# Patient Record
Sex: Male | Born: 1950 | Race: White | Hispanic: No | Marital: Married | Smoking: Former smoker
Health system: Southern US, Community
[De-identification: ages and names within clinical notes are randomized; demographics above are authoritative.]

## PROBLEM LIST (undated history)

## (undated) DIAGNOSIS — A6920 Lyme disease, unspecified: Secondary | ICD-10-CM

## (undated) DIAGNOSIS — E785 Hyperlipidemia, unspecified: Secondary | ICD-10-CM

## (undated) DIAGNOSIS — H269 Unspecified cataract: Secondary | ICD-10-CM

## (undated) DIAGNOSIS — N200 Calculus of kidney: Secondary | ICD-10-CM

## (undated) DIAGNOSIS — I6529 Occlusion and stenosis of unspecified carotid artery: Secondary | ICD-10-CM

## (undated) DIAGNOSIS — E039 Hypothyroidism, unspecified: Secondary | ICD-10-CM

## (undated) DIAGNOSIS — M47816 Spondylosis without myelopathy or radiculopathy, lumbar region: Secondary | ICD-10-CM

## (undated) DIAGNOSIS — N281 Cyst of kidney, acquired: Secondary | ICD-10-CM

## (undated) DIAGNOSIS — I10 Essential (primary) hypertension: Secondary | ICD-10-CM

## (undated) HISTORY — DX: Cyst of kidney, acquired: N28.1

## (undated) HISTORY — DX: Hypothyroidism, unspecified: E03.9

## (undated) HISTORY — DX: Essential (primary) hypertension: I10

## (undated) HISTORY — DX: Unspecified cataract: H26.9

## (undated) HISTORY — DX: Spondylosis without myelopathy or radiculopathy, lumbar region: M47.816

## (undated) HISTORY — DX: Lyme disease, unspecified: A69.20

## (undated) HISTORY — DX: Occlusion and stenosis of unspecified carotid artery: I65.29

## (undated) HISTORY — DX: Calculus of kidney: N20.0

## (undated) HISTORY — DX: Hyperlipidemia, unspecified: E78.5

---

## 1994-09-29 ENCOUNTER — Encounter: Payer: Self-pay | Admitting: Family Medicine

## 1994-09-29 LAB — CONVERTED CEMR LAB: PSA: 0.8 ng/mL

## 2001-04-29 ENCOUNTER — Encounter: Payer: Self-pay | Admitting: Family Medicine

## 2001-04-29 LAB — CONVERTED CEMR LAB: PSA: 0.5 ng/mL

## 2001-10-28 HISTORY — PX: COLONOSCOPY: SHX174

## 2004-04-29 ENCOUNTER — Encounter: Payer: Self-pay | Admitting: Family Medicine

## 2004-04-29 LAB — CONVERTED CEMR LAB: PSA: 0.8 ng/mL

## 2004-10-19 HISTORY — PX: COLONOSCOPY: SHX174

## 2005-05-31 ENCOUNTER — Ambulatory Visit: Payer: Self-pay | Admitting: Family Medicine

## 2005-08-09 ENCOUNTER — Ambulatory Visit: Payer: Self-pay | Admitting: Family Medicine

## 2005-11-12 ENCOUNTER — Ambulatory Visit: Payer: Self-pay | Admitting: Family Medicine

## 2005-12-30 HISTORY — PX: FOREIGN BODY REMOVAL: SHX962

## 2006-01-14 ENCOUNTER — Ambulatory Visit: Payer: Self-pay | Admitting: Family Medicine

## 2006-08-19 ENCOUNTER — Ambulatory Visit: Payer: Self-pay | Admitting: Family Medicine

## 2006-08-19 LAB — CONVERTED CEMR LAB
PSA: 0.52 ng/mL
TSH: 8.08 u[IU]/mL

## 2006-08-21 ENCOUNTER — Ambulatory Visit: Payer: Self-pay | Admitting: Family Medicine

## 2006-11-25 ENCOUNTER — Ambulatory Visit: Payer: Self-pay | Admitting: Family Medicine

## 2007-05-22 ENCOUNTER — Encounter: Payer: Self-pay | Admitting: Family Medicine

## 2007-05-25 DIAGNOSIS — R7303 Prediabetes: Secondary | ICD-10-CM | POA: Insufficient documentation

## 2007-05-25 DIAGNOSIS — Z87891 Personal history of nicotine dependence: Secondary | ICD-10-CM | POA: Insufficient documentation

## 2007-05-25 DIAGNOSIS — E039 Hypothyroidism, unspecified: Secondary | ICD-10-CM | POA: Insufficient documentation

## 2007-05-25 DIAGNOSIS — M479 Spondylosis, unspecified: Secondary | ICD-10-CM | POA: Insufficient documentation

## 2007-05-25 DIAGNOSIS — I10 Essential (primary) hypertension: Secondary | ICD-10-CM

## 2007-05-25 DIAGNOSIS — E785 Hyperlipidemia, unspecified: Secondary | ICD-10-CM | POA: Insufficient documentation

## 2007-05-26 ENCOUNTER — Ambulatory Visit: Payer: Self-pay | Admitting: Family Medicine

## 2009-09-21 ENCOUNTER — Encounter (INDEPENDENT_AMBULATORY_CARE_PROVIDER_SITE_OTHER): Payer: Self-pay | Admitting: *Deleted

## 2010-02-08 ENCOUNTER — Ambulatory Visit: Payer: Self-pay | Admitting: Family Medicine

## 2010-02-08 DIAGNOSIS — L03119 Cellulitis of unspecified part of limb: Secondary | ICD-10-CM | POA: Insufficient documentation

## 2010-02-08 DIAGNOSIS — L02519 Cutaneous abscess of unspecified hand: Secondary | ICD-10-CM | POA: Insufficient documentation

## 2010-02-15 ENCOUNTER — Ambulatory Visit: Payer: Self-pay | Admitting: Family Medicine

## 2010-05-22 ENCOUNTER — Telehealth (INDEPENDENT_AMBULATORY_CARE_PROVIDER_SITE_OTHER): Payer: Self-pay | Admitting: *Deleted

## 2010-08-07 ENCOUNTER — Encounter (INDEPENDENT_AMBULATORY_CARE_PROVIDER_SITE_OTHER): Payer: Self-pay | Admitting: *Deleted

## 2010-08-16 ENCOUNTER — Encounter: Payer: Self-pay | Admitting: Internal Medicine

## 2010-09-25 ENCOUNTER — Encounter: Payer: Self-pay | Admitting: Internal Medicine

## 2010-11-02 ENCOUNTER — Encounter (INDEPENDENT_AMBULATORY_CARE_PROVIDER_SITE_OTHER): Payer: Self-pay | Admitting: *Deleted

## 2010-11-06 ENCOUNTER — Ambulatory Visit: Payer: Self-pay | Admitting: Internal Medicine

## 2010-11-20 ENCOUNTER — Ambulatory Visit: Payer: Self-pay | Admitting: Internal Medicine

## 2010-11-20 HISTORY — PX: COLONOSCOPY: SHX174

## 2011-01-23 ENCOUNTER — Ambulatory Visit
Admission: RE | Admit: 2011-01-23 | Discharge: 2011-01-23 | Payer: Self-pay | Source: Home / Self Care | Attending: Family Medicine | Admitting: Family Medicine

## 2011-01-30 NOTE — Letter (Signed)
Summary: [facility] Instructions  [facility]  [address] [place], [place] [address]   Phone: [phone]  Fax: [phone]       SOMTOCHUKWU WOOLLARD    03-16-1951    MRN: [id number]        Procedure Day /Date: Tuesday 11-20-10     Arrival Time: 7:30 am     Procedure Time: 8:30 am     Location of Procedure:                    _x _  [facility] (4th Floor)                        PREPARATION FOR COLONOSCOPY WITH MOVIPREP   Starting 5 days prior to your procedure  11-15-10 do not eat nuts, seeds, popcorn, corn, beans, peas,  salads, or any raw vegetables.  Do not take any fiber supplements (e.g. Metamucil, Citrucel, and Benefiber).  THE DAY BEFORE YOUR PROCEDURE         DATE:  11-19-10  DAY: Monday  1.  Drink clear liquids the entire day-NO SOLID FOOD  2.  Do not drink anything colored red or purple.  Avoid juices with pulp.  No orange juice.  3.  Drink at least 64 oz. (8 glasses) of fluid/clear liquids during the day to prevent dehydration and help the prep work efficiently.  CLEAR LIQUIDS INCLUDE: Water Jello Ice Popsicles Tea (sugar ok, no milk/cream) Powdered fruit flavored drinks Coffee (sugar ok, no milk/cream) Gatorade Juice: apple, white grape, white cranberry  Lemonade Clear bullion, consomm, broth Carbonated beverages (any kind) Strained chicken noodle soup Hard Candy                             4.  In the morning, mix first dose of MoviPrep solution:    Empty 1 Pouch A and 1 Pouch B into the disposable container    Add lukewarm drinking water to the top line of the container. Mix to dissolve    Refrigerate (mixed solution should be used within 24 hrs)  5.  Begin drinking the prep at 5:00 p.m. The MoviPrep container is divided by 4 marks.   Every 15 minutes drink the solution down to the next mark (approximately 8 oz) until the full liter is complete.   6.  Follow completed prep with 16 oz of clear liquid of your choice  (Nothing red or purple).  Continue to drink clear liquids until bedtime.  7.  Before going to bed, mix second dose of MoviPrep solution:    Empty 1 Pouch A and 1 Pouch B into the disposable container    Add lukewarm drinking water to the top line of the container. Mix to dissolve    Refrigerate  THE DAY OF YOUR PROCEDURE      DATE: 11-20-10  DAY:  Tuesday  Beginning at  3:30 a.m. (5 hours before procedure):         1. Every 15 minutes, drink the solution down to the next mark (approx 8 oz) until the full liter is complete.  2. Follow completed prep with 16 oz. of clear liquid of your choice.    3. You may drink clear liquids until  6:30 a.m.  (2 HOURS BEFORE PROCEDURE).   MEDICATION INSTRUCTIONS  Unless otherwise instructed, you should take regular prescription medications with a small sip of water  as early as possible the morning of your procedure.        OTHER INSTRUCTIONS  You will need a responsible adult at least 62 years of age to accompany you and drive you home.   This person must remain in the waiting room during your procedure.  Wear loose fitting clothing that is easily removed.  Leave jewelry and other valuables at home.  However, you may wish to bring a book to read or  an iPod/MP3 player to listen to music as you wait for your procedure to start.  Remove all body piercing jewelry and leave at home.  Total time from sign-in until discharge is approximately 2-3 hours.  You should go home directly after your procedure and rest.  You can resume normal activities the  day after your procedure.  The day of your procedure you should not:   Drive   Make legal decisions   Operate machinery   Drink alcohol   Return to work  You will receive specific instructions about eating, activities and medications before you leave.    The above instructions have been reviewed and explained to me by  Wyona Almas RN  November 06, 2010 8:23 AM     I fully  understand and can verbalize these instructions _____________________________ Date _________

## 2011-01-30 NOTE — Progress Notes (Signed)
Summary: Schedule recall colonoscopy  Phone Note Outgoing Call   Call placed by: Christie Nottingham CMA Duncan Dull),  May 22, 2010 1:44 PM Call placed to: Patient Summary of Call: Called pt to schedule recall colonoscopy and pt states that he is retired cannot afford to have the procedure done right now but would like to schedule in 6 months time. Pt would like a reminder letter in 6 months to have the procedure done. Letter reminder put in IDX. Initial call taken by: Christie Nottingham CMA Duncan Dull),  May 22, 2010 1:45 PM

## 2011-01-30 NOTE — Assessment & Plan Note (Signed)
 Summary: FOLLOW UP/EVE   Vital Signs:  Patient Profile:   60 Years Old Male Weight:      177 pounds Temp:     98.5 degrees F oral Pulse rate:   60 / minute BP sitting:   130 / 80  (left arm)  Vitals Entered By: Apolinar Cones (May 26, 2007 3:56 PM)               Chief Complaint:  6 MONTH F/U.  History of Present Illness: No complaints except anxious to be here.  Feels well with no real complaints.  Cough of chronic nature when here last time is resolved.  He has list of BPs taken over last few weeks which are all below 130/80 except one.   Current Allergies: No known allergies     Risk Factors:  Passive smoke exposure:  no Drug use:  no HIV high-risk behavior:  no   Review of Systems       The patient complains of decreased hearing.  The patient denies anorexia, fever, weight loss, vision loss, hoarseness, chest pain, syncope, dyspnea on exhertion, peripheral edema, prolonged cough, hemoptysis, abdominal pain, melena, hematochezia, severe indigestion/heartburn, hematuria, incontinence, genital sores, muscle weakness, suspicious skin lesions, transient blindness, difficulty walking, depression, unusual weight change, abnormal bleeding, enlarged lymph nodes, angioedema, breast masses, and testicular masses.         wears hearing aids bilaterally.   Physical Exam  General:     Well-developed,well-nourished,in no acute distress; alert,appropriate and cooperative throughout examination Head:     Normocephalic and atraumatic without obvious abnormalities. No apparent alopecia or balding. Eyes:     Conjunctiva clear bilaterally.  Ears:     External ear exam shows no significant lesions or deformities.  Otoscopic examination reveals clear canals, tympanic membranes are intact bilaterally without bulging, retraction, inflammation or discharge. Hearing aids in place bilat. Nose:     External nasal examination shows no deformity or inflammation. Nasal mucosa are pink and  moist without lesions or exudates. Mouth:     Oral mucosa and oropharynx without lesions or exudates.  Teeth in good repair. Neck:     No deformities, masses, or tenderness noted. Chest Wall:     No deformities, masses, tenderness or gynecomastia noted. Lungs:     Normal respiratory effort, chest expands symmetrically. Lungs are clear to auscultation, no crackles or wheezes. Heart:     Normal rate and regular rhythm. S1 and S2 normal without gallop, murmur, click, rub or other extra sounds.    Impression & Recommendations:  Problem # 1:  HYPERTENSION (ICD-401.9) Assessment: Improved Doing well on no meds.  Will continue to follow. Stay away from salt and keep weight down (has found if he stops sodas, he can drop five poundsquickly which he did the last few wekks to prepare fo rthis visit.) BP today: 130/80   Problem # 2:  HYPERGLYCEMIA (ICD-790.6) Assessment: Unchanged Encouraged as above ...sodas probably important here also.   Patient Instructions: 1)  At pt's request, RTC 10 months for Comp Exam, labs prior. Pt will call in 4 mos.

## 2011-01-30 NOTE — Letter (Signed)
Summary: Pre Visit Letter Revised  [facility]  [address] [place], [place] [address]   Phone: [phone]  Fax: [phone]        09/25/2010 MRN: [id number]   [facility] [address] [place], [place]  [address]             Procedure Date:  November 20, 2010   Welcome to the Gastroenterology Division at [facility].    You are scheduled to see a nurse for your pre-procedure visit on November 06, 2010 at 8:00am on the 3rd floor at [facility], [address]  We ask that you try to arrive at our office 15 minutes prior to your appointment time to allow for check-in.  Please take a minute to review the attached form.  If you answer "Yes" to one or more of the questions on the first page, we ask that you call the person listed at your earliest opportunity.  If you answer "No" to all of the questions, please complete the rest of the form and bring it to your appointment.    Your nurse visit will consist of discussing your medical and surgical history, your immediate family medical history, and your medications.   If you are unable to list all of your medications on the form, please bring the medication bottles to your appointment and we will list them.  We will need to be aware of both prescribed and over the counter drugs.  We will need to know exact dosage information as well.    Please be prepared to read and sign documents such as consent forms, a financial agreement, and acknowledgement forms.  If necessary, and with your consent, a friend or relative is welcome to sit-in on the nurse visit with you.  Please bring your insurance card so that we may make a copy of it.  If your insurance requires a referral to see a specialist, please bring your referral form from your primary care physician.  No co-pay is required for this nurse visit.     If you cannot keep your appointment, please call [phone] to cancel or reschedule prior to your  appointment date.  This allows [place] the opportunity to schedule an appointment for another patient in need of care.    Thank you for choosing [facility] for your medical needs.  We appreciate the opportunity to care for you.  Please visit [place] at our website  to learn more about our practice.  Sincerely, The Gastroenterology Division

## 2011-01-30 NOTE — Assessment & Plan Note (Signed)
Summary: ROA /JRR   Vital Signs:  Patient profile:   60 year old male Weight:      185.75 pounds Temp:     98.6 degrees F oral Pulse rate:   72 / minute Pulse rhythm:   regular BP sitting:   150 / 84  (left arm) Cuff size:   regular  Vitals Entered By: Sydell Axon LPN (February 15, 2010 9:57 AM) CC: Follow-up on splinter in left hand   History of Present Illness: Pt here for followup of cellulitis of the left hand presumably from a splinter, not seen. He has responded well to Ab. He has soaked as directed form11/2 days!   He aslo has jury summons and has hearing difficulties, wears hearing aids and would not be able to hear much in the courtroom. He also got scratched by his cat last night on the left upper eyelid.  Problems Prior to Update: 1)  Cellulitis, Hand, Left  (ICD-682.4) 2)  Fungal Dermatitis, Inguinal Area  (ICD-111.9) 3)  Hyperglycemia  (ICD-790.6) 4)  Hx of Disorder, Tobacco Use  (ICD-305.1) 5)  Hx of Degenerative Joint Disease, Lumbar Spine  (ICD-721.90) 6)  Hx of Hypercholesterolemia, Borderline  (ICD-272.4) 7)  Hypothyroidism  (ICD-244.9) 8)  Hypertension  (ICD-401.9)  Medications Prior to Update: 1)  Keflex 500 Mg Caps (Cephalexin) .... One Tab By Mouth 4 Times A Day. 2)  Nystatin 100000 Unit/gm Crea (Nystatin) .... Apply To Area Two Times A Day For  Minimum One Week  Allergies: No Known Drug Allergies  Physical Exam  General:  Well-developed,well-nourished,in no acute distress; alert,appropriate and cooperative throughout examination Head:  Normocephalic and atraumatic without obvious abnormalities. No apparent alopecia or balding. Eyes:  Conjunctiva clear bilaterally. 4mm scratch of the left upper eyelid, scabbed and looks to be healing, no erythema or swelling. Ears:  External ear exam shows no significant lesions or deformities.  Otoscopic examination reveals clear canals, tympanic membranes are intact bilaterally without bulging, retraction,  inflammation or discharge. Hearing aids in place bilat. Nose:  External nasal examination shows no deformity or inflammation. Nasal mucosa are pink and moist without lesions or exudates. Mouth:  Oral mucosa and oropharynx without lesions or exudates.  Teeth in good repair. Skin:  Left hand no longer swollen and very faint erythema generally of the MCP area, dorsal surface o/w back to nml. Sensation is intact. 2Pt discimination at the fingertips is intact.  Intertriginal area signif for oval macular minimally  erythematously outlined rash. Improved. Cervical Nodes:  No lymphadenopathy noted Axillary Nodes:  No palpable lymphadenopathy   Impression & Recommendations:  Problem # 1:  CELLULITIS, HAND, LEFT (ICD-682.4) Assessment Improved  Extend Keflex another 7 days. His updated medication list for this problem includes:    Keflex 500 Mg Caps (Cephalexin) ..... One tab by mouth 4 times a day.    Keflex 500 Mg Caps (Cephalexin) ..... One tab 4 times a day  Elevate affected area. Warm moist compresses for 20 minutes every 2 hours while awake. Take antibiotics as directed and take acetaminophen as needed. To be seen in 48-72 hours if no improvement, sooner if worse.  Problem # 2:  FUNGAL DERMATITIS, INGUINAL AREA (ICD-111.9) Assessment: Improved Cont two times a day applic until gone for 24 hrs. His updated medication list for this problem includes:    Nystatin 100000 Unit/gm Crea (Nystatin) .Marland Kitchen... 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  Problem # 4:  CAT SCRATCH (ICD-959.9) Assessment: New Left upper eyelid, looks to be healing, no adenopathy felt. Is on Abs.  Problem # 5:  HYPERTENSION (ICD-401.9) Assessment: Unchanged Still mildly elevated. Address next visit. BP today: 150/84 Prior BP: 144/84 (02/08/2010)  Complete  Medication List: 1)  Keflex 500 Mg Caps (Cephalexin) .... One tab by mouth 4 times a day. 2)  Nystatin 100000 Unit/gm Crea (Nystatin) .... Apply to area two times a day for  minimum one week 3)  Keflex 500 Mg Caps (Cephalexin) .... One tab 4 times a day  Patient Instructions: 1)  Check BP and adjust as needed. Prescriptions: KEFLEX 500 MG CAPS (CEPHALEXIN) one tab 4 times a day  #28 x 0   Entered and Authorized by:   Shaune Leeks MD   Signed by:   Shaune Leeks MD on 02/15/2010   Method used:   [facility]        [facility]  [address] (retail)       [address]       [place], [place]  [address]       Ph: [phone] or [phone]       Fax: [phone]   RxID:   [id number]   Current Allergies (reviewed today): No known allergies

## 2011-01-30 NOTE — Letter (Signed)
Summary: Generic Letter  [place] [facility]  [address] [place], [place] [address]   Phone: [phone]  Fax: [phone]    02/15/2010  To whom it may concern:  RE: Henry Baldwin     [address]     [place], [place]  [address]   Mr. DURNIN is a patientof mine who has hearing decliune and wears hearing aids which do not totally replace his hearing deficit. It would be very difficult for him to hear the proceedings. Please excuse from jury duty.   Sincerely,   Laurita Quint MD

## 2011-01-30 NOTE — Letter (Signed)
 Summary: Recall Colonoscopy Letter  [facility]  [address] [place], [place] [address]   Phone: [phone]  Fax: [phone]      September 21, 2009 MRN: [id number]   [facility] PASION [address] [place], [place]  [address]   Dear Mr. NEWMARK,   According to your medical record, it is time for you to schedule a Colonoscopy. The American Cancer Society recommends this procedure as a method to detect early colon cancer. Patients with a family history of colon cancer, or a personal history of colon polyps or inflammatory bowel disease are at increased risk.  This letter has beeen generated based on the recommendations made at the time of your procedure. If you feel that in your particular situation this may no longer apply, please contact our office.  Please call our office at [phone] to schedule this appointment or to update your records at your earliest convenience.  Thank you for cooperating with us  to provide you with the very best care possible.   Sincerely,  Norleen SAILOR. Abran, M.D.  [facility]astroenter[facility] Division [phone]

## 2011-01-30 NOTE — Miscellaneous (Signed)
Summary: lec pREVISIT/PREP  Clinical Lists Changes  Medications: Added new medication of MOVIPREP 100 GM  SOLR (PEG-KCL-NACL-NASULF-NA ASC-C) As per prep instructions. - Signed Rx of MOVIPREP 100 GM  SOLR (PEG-KCL-NACL-NASULF-NA ASC-C) As per prep instructions.;  #1 x 0;  Signed;  Entered by: Wyona Almas RN;  Authorized by: Hilarie Fredrickson MD;  Method used: [facility]  [address], [place], [place]  [address], Ph: [phone] or [phone], Fax: [phone] Observations: Added new observation of NKA: T (11/06/2010 7:58)    Prescriptions: MOVIPREP 100 GM  SOLR (PEG-KCL-NACL-NASULF-NA ASC-C) As per prep instructions.  #1 x 0   Entered by:   Wyona Almas RN   Authorized by:   Hilarie Fredrickson MD   Signed by:   Wyona Almas RN on 11/06/2010   Method used:   [facility]        [facility]  [address] (retail)       [address]       [place], [place]  [address]       Ph: [phone] or [phone]       Fax: [phone]   RxID:   [id number]

## 2011-01-30 NOTE — Letter (Signed)
Summary: Nadara Eaton letter  [place] [facility]  [address] [place], [place] [address]   Phone: [phone]  Fax: [phone]       08/07/2010 MRN: [id number]  SPARSH CALLENS [address] [place], [place]  [address]  Dear Mr. OBRYANT,  [facility] [place], and [facility] announce the retirement of Arta Silence, M.D., from full-time practice at the [facility] office effective June 28, 2010 and his plans of returning part-time.  It is important to Dr. Hetty Ely and to our practice that you understand that [facility] has seven physicians in our office for your health care needs.  We will continue to offer the same exceptional care that you have today.    Dr. Hetty Ely has spoken to many of you about his plans for retirement and returning part-time in the fall.   We will continue to work with you through the transition to schedule appointments for you in the office and meet the high standards that [place] is committed to.   Again, it is with great pleasure that we share the news that Dr. Hetty Ely will return to [facility] at [facility] in October of 2011 with a reduced schedule.    If you have any questions, or would like to request an appointment with one of our physicians, please call us at [phone] and press the option for Scheduling an appointment.  We take pleasure in providing you with excellent patient care and look forward to seeing you at your next office visit.  Our [facility] Physicians are:  Tillman Abide, M.D. Laurita Quint, M.D. Roxy Manns, M.D. Kerby Nora, M.D. Hannah Beat, M.D. Ruthe Mannan, M.D. We proudly welcomed Raechel Ache, M.D. and Eustaquio Boyden, M.D. to the practice in July/August 2011.  Sincerely,  [facility]

## 2011-01-30 NOTE — Procedures (Signed)
Summary: Colonoscopy  Patient: Mikaeel Petrow Note: All result statuses are Final unless otherwise noted.  Tests: (1) Colonoscopy (COL)   COL Colonoscopy           DONE     [facility]     [address]     [place], [place]  [address]           COLONOSCOPY PROCEDURE REPORT           PATIENT:  Aniketh, Huberty  MR#:  [id number]     BIRTHDATE:  September 29, 1951, 59 yrs. old  GENDER:  male     ENDOSCOPIST:  Wilhemina Bonito. Eda Keys, MD     REF. BY:  Surveillance Program Recall     PROCEDURE DATE:  11/20/2010     PROCEDURE:  Surveillance Colonoscopy     ASA CLASS:  Class II     INDICATIONS:  history of polyps, family history of colon cancer     (parent 91), surveillance and high-risk screening ; 2002 polyp,     2005 neg     MEDICATIONS:   Fentanyl 100 mcg IV, Versed 10 mg IV, Benadryl 25     mg IV           DESCRIPTION OF PROCEDURE:   After the risks benefits and     alternatives of the procedure were thoroughly explained, informed     consent was obtained.  Digital rectal exam was performed and     revealed no abnormalities.   The LB 180AL [id number] endoscope was     introduced through the anus and advanced to the cecum, which was     identified by both the appendix and ileocecal valve, without     limitations.  The quality of the prep was excellent, using     MoviPrep.  The instrument was then slowly withdrawn as the colon     was fully examined.     <<PROCEDUREIMAGES>>           FINDINGS:  A normal appearing cecum, ileocecal valve, and     appendiceal orifice were identified. The ascending, hepatic     flexure, transverse, splenic flexure, descending, sigmoid colon,     and rectum appeared unremarkable.  No polyps or cancers were seen.     Retroflexed views in the rectum revealed internal hemorrhoids.     The scope was then withdrawn from the patient and the procedure     completed.           COMPLICATIONS:  None           ENDOSCOPIC IMPRESSION:     1) Normal colonoscopy  2) No polyps or cancers     3) Internal hemorrhoids           RECOMMENDATIONS:     1) Follow up colonoscopy in 5 years           ______________________________     Wilhemina Bonito. Eda Keys, MD           CC:  Arta Silence, MD; The Patient           n.     eSIGNED:   Wilhemina Bonito. Eda Keys at 11/20/2010 09:29 AM           Alba Destine, [id number]  Note: An exclamation mark (!) indicates a result that was not dispersed into the flowsheet. Document Creation Date: 11/20/2010 9:30 AM _______________________________________________________________________  (1) Order result status: Final Collection or observation date-time:  11/20/2010 09:20 Requested date-time:  Receipt date-time:  Reported date-time:  Referring Physician:   Ordering Physician: Fransico Setters [phone]) Specimen Source:  Source: Launa Grill Order Number: [id number] Lab site:   Appended Document: Colonoscopy    Clinical Lists Changes  Observations: Added new observation of COLONNXTDUE: 10/2015 (11/20/2010 9:50)      Appended Document: Colonoscopy    Clinical Lists Changes  Observations: Added new observation of PAST SURG HX: Fx L arm  as child CXR- [facility], normal (10/1994) Colonoscopy- polypectomy (10/28/2001) Colonoscopy- divertics, no polyps (10/19/2004) Right little finger splinter removal (2002) Colonoscopy nml (Dr Marina Goodell) (11/20/2010)       64yrs  (11/20/2010 11:24)       Past Surgical History:    Fx L arm  as child    CXR- [facility], normal (10/1994)    Colonoscopy- polypectomy (10/28/2001)    Colonoscopy- divertics, no polyps (10/19/2004)    Right little finger splinter removal (2002)    Colonoscopy nml (Dr Marina Goodell) (11/20/2010)       60yrs

## 2011-01-30 NOTE — Assessment & Plan Note (Signed)
Summary: SPLINTER IN HAND   Vital Signs:  Patient profile:   60 year old male Height:      68.5 inches Weight:      179 pounds BMI:     26.92 Temp:     98.5 degrees F oral Pulse rate:   68 / minute Pulse rhythm:   regular BP sitting:   144 / 84  (left arm) Cuff size:   regular  Vitals Entered By: Sydell Axon LPN (February 08, 2010 4:07 PM) CC: ? Splinger in left hand, now red and swollen   History of Present Illness: Pt here for what he thinks is a splintr in his L hand, at the crease laterally of his midpalm. He has had a previous episode of swelling and presumed infection from a splinter under the little finger of the right hand requiring hospitalization and surgery of the area to include nail removal and what sounds likedecompression of the finger proximal to the nailbed. He did well from that but knows not to let things go that long again. His hand was tender and minimally swollen last night. He saw a break in the skin, assumed a splinter and soaked the hand and then had his wife try to open the area for drainage. They were able to identify no foreign body. The hand is now swollen and erythematous, not particularly tender. He has not had fever and feels well. He also has a rash in the intertriginal area of the crotch, bilat which is slightly itchy, minimal burning.  Problems Prior to Update: 1)  Hyperglycemia  (ICD-790.6) 2)  Hx of Disorder, Tobacco Use  (ICD-305.1) 3)  Hx of Degenerative Joint Disease, Lumbar Spine  (ICD-721.90) 4)  Hx of Hypercholesterolemia, Borderline  (ICD-272.4) 5)  Hypothyroidism  (ICD-244.9) 6)  Hypertension  (ICD-401.9)  Medications Prior to Update: 1)  None  Allergies (verified): No Known Drug Allergies  Physical Exam  General:  Well-developed,well-nourished,in no acute distress; alert,appropriate and cooperative throughout examination Head:  Normocephalic and atraumatic without obvious abnormalities. No apparent alopecia or balding. Eyes:   Conjunctiva clear bilaterally.  Ears:  External ear exam shows no significant lesions or deformities.  Otoscopic examination reveals clear canals, tympanic membranes are intact bilaterally without bulging, retraction, inflammation or discharge. Hearing aids in place bilat. Nose:  External nasal examination shows no deformity or inflammation. Nasal mucosa are pink and moist without lesions or exudates. Mouth:  Oral mucosa and oropharynx without lesions or exudates.  Teeth in good repair. Neck:  No deformities, masses, or tenderness noted. Lungs:  Normal respiratory effort, chest expands symmetrically. Lungs are clear to auscultation, no crackles or wheezes. Heart:  Normal rate and regular rhythm. S1 and S2 normal without gallop, murmur, click, rub or other extra sounds. Skin:  Left hand swollen distally of the palm and dorsum as well as fingers 3,4 and 5. He has a 5mm open place in the crease of the mid palm laterally that is nonfluctulant and expresseds nothing. Sensation is intact. 2Pt discimination at the fingertips is intact.  Intertriginal area signif for oval macular erythematously outlined rash.   Impression & Recommendations:  Problem # 1:  CELLULITIS, HAND, LEFT (ICD-682.4) Assessment New  Start keflex qid, Soak and elevate.  RTC 1 week for reassessment. His updated medication list for this problem includes:    Keflex 500 Mg Caps (Cephalexin) ..... One tab by mouth 4 times a day.  Elevate affected area. Warm moist compresses for 20 minutes every 2 hours while awake.  Take antibiotics as directed and take acetaminophen as needed. To be seen in 48-72 hours if no improvement, sooner if worse.  Problem # 2:  FUNGAL DERMATITIS, INGUINAL AREA (ICD-111.9) Assessment: New  Keep clean and dry. Apply Nystatin. His updated medication list for this problem includes:    Nystatin 100000 Unit/gm Crea (Nystatin) .Marland Kitchen... Apply to area two times a day for  minimum one week  Use  medication as  directed for full duration.   Complete Medication List: 1)  Keflex 500 Mg Caps (Cephalexin) .... One tab by mouth 4 times a day. 2)  Nystatin 100000 Unit/gm Crea (Nystatin) .... Apply to area two times a day for  minimum one week  Patient Instructions: 1)  RTC one week sooner if sxs worsen. Prescriptions: NYSTATIN 100000 UNIT/GM CREA (NYSTATIN) apply to area two times a day for  minimum one week  #30gms x 1   Entered and Authorized by:   Shaune Leeks MD   Signed by:   Shaune Leeks MD on 02/08/2010   Method used:   Electronically to        [facility]  [address] (retail)       [address]       [place], [place]  [address]       Ph: [phone] or [phone]       Fax: [phone]   RxID:   [id number] KEFLEX 500 MG CAPS (CEPHALEXIN) one tab by mouth 4 times a day.  #40 x 0   Entered and Authorized by:   Shaune Leeks MD   Signed by:   Shaune Leeks MD on 02/08/2010   Method used:   [facility]        [facility]  [address] (retail)       [address]       [place], [place]  [address]       Ph: [phone] or [phone]       Fax: [phone]   RxID:   [id number]   Prior Medications (reviewed today): None Current Allergies (reviewed today): No known allergies

## 2011-01-30 NOTE — Letter (Signed)
Summary: Colonoscopy Letter  [facility]  [address] [place], [place] [address]   Phone: [phone]  Fax: [phone]      August 16, 2010 MRN: [id number]   Henry Baldwin [address] [place], [place]  [address]   Dear Mr. FAWBUSH,   According to your medical record, it is time for you to schedule a Colonoscopy. The American Cancer Society recommends this procedure as a method to detect early colon cancer. Patients with a family history of colon cancer, or a personal history of colon polyps or inflammatory bowel disease are at increased risk.  This letter has been generated based on the recommendations made at the time of your procedure. If you feel that in your particular situation this may no longer apply, please contact our office.  Please call our office at [phone] to schedule this appointment or to update your records at your earliest convenience.  Thank you for cooperating with [place] to provide you with the very best care possible.   Sincerely,  Wilhemina Bonito. Marina Goodell, M.D.  [facility] [phone]

## 2011-01-31 NOTE — Assessment & Plan Note (Signed)
Summary: Grief response over loss of pet//kad   Vital Signs:  Patient profile:   60 year old male Height:      68.5 inches Weight:      183.75 pounds BMI:     27.63 Temp:     98.1 degrees F oral Pulse rate:   64 / minute Pulse rhythm:   regular BP sitting:   130 / 80  (left arm) Cuff size:   regular  Vitals Entered By: Selena Batten Dance CMA Duncan Dull) (January 23, 2011 9:04 AM) CC: Grieving loss of pet   History of Present Illness: CC: grief response  presents with wife  recent loss of cat over weekend.  pet for 20 1/2 years.  had been pt's close longterm companion.  in process of dying over 3 wks, finally wife had put down.  pt retired.  + anhedonia.  appetite down, insomnia.  No SI/HI.  pt unable to keep it together, cryings pells.  Current Medications (verified): 1)  Aspirin 81 Mg Tbec (Aspirin) .Marland Kitchen.. 1 By Mouth Once Weekly As Needed  Allergies (verified): No Known Drug Allergies  Past History:  Past Medical History: Last updated: 05/22/2007 Hypertension Hypothyroidism Family hx. of colon, prostate and breast cancer  Social History: Last updated: 05/22/2007 Marital Status: Married Children: 1 Occupation: [employer]  Review of Systems       per HPI  Physical Exam  General:  Well-developed,well-nourished,in no acute distress; alert,appropriate and cooperative throughout examination Psych:  tearful with discussion of loss of pet.  no SI/HI.  good eye contact.  breaks down several times during visit.dysphoric affect.     Impression & Recommendations:  Problem # 1:  GRIEF REACTION, ACUTE (ICD-309.0) ativan temporarily.  discussed benzos and addiction potential so use only as needed.  discussed care with benzo and EtOH.  call us with update in 2 wks.  return sooner if needed or in 1 mo if still needing benzo for more long term management.  Complete Medication List: 1)  Aspirin 81 Mg Tbec (Aspirin) .Marland Kitchen.. 1 by mouth once weekly as needed 2)  Ativan 1  Mg Tabs (Lorazepam) .... Take one two times a day as needed anxiety  Patient Instructions: 1)  You are going through grieving process. 2)  Ativan 1mg , start by taking 1/2 pill up to two times a day as needed. 3)  Good to meet you all today.  4)  Call us with update in 1-2 wks.  return if not doing well, or return if still needing meds after 1 month. Prescriptions: ATIVAN 1 MG TABS (LORAZEPAM) take one two times a day as needed anxiety  #20 x 0   Entered and Authorized by:   Eustaquio Boyden  MD   Signed by:   Eustaquio Boyden  MD on 01/23/2011   Method used:   Print then Give to Patient   RxID:   [id number]    Orders Added: 1)  Est. Patient Level III [14782]    Current Allergies (reviewed today): No known allergies

## 2013-05-18 ENCOUNTER — Telehealth: Payer: Self-pay | Admitting: Family Medicine

## 2013-05-18 ENCOUNTER — Encounter: Payer: Self-pay | Admitting: Family Medicine

## 2013-05-18 ENCOUNTER — Ambulatory Visit (INDEPENDENT_AMBULATORY_CARE_PROVIDER_SITE_OTHER): Payer: BC Managed Care – PPO | Admitting: Family Medicine

## 2013-05-18 ENCOUNTER — Telehealth: Payer: Self-pay

## 2013-05-18 VITALS — BP 144/82 | HR 64 | Temp 97.9°F | Wt 181.5 lb

## 2013-05-18 DIAGNOSIS — M25529 Pain in unspecified elbow: Secondary | ICD-10-CM

## 2013-05-18 DIAGNOSIS — W57XXXA Bitten or stung by nonvenomous insect and other nonvenomous arthropods, initial encounter: Secondary | ICD-10-CM

## 2013-05-18 DIAGNOSIS — T148 Other injury of unspecified body region: Secondary | ICD-10-CM

## 2013-05-18 DIAGNOSIS — M25521 Pain in right elbow: Secondary | ICD-10-CM

## 2013-05-18 DIAGNOSIS — A6920 Lyme disease, unspecified: Secondary | ICD-10-CM

## 2013-05-18 HISTORY — DX: Lyme disease, unspecified: A69.20

## 2013-05-18 MED ORDER — DOXYCYCLINE HYCLATE 100 MG PO CAPS: 100.0000 mg | ORAL_CAPSULE | Freq: Two times a day (BID) | ORAL | Status: DC

## 2013-05-18 NOTE — Telephone Encounter (Signed)
To: [facility] (After Hours Triage) Fax: [phone] From: [facility] Date/ Time: 05/17/2013 6:06 PM Taken By: Cora Collum, RN Caller: Steward Drone Facility: Not Collected Patient: Henry Baldwin, Henry Baldwin DOB: 26-Feb-1951 Phone: [phone] Reason for Call: Caller was unable to be reached on callback - Left Message Regarding Appointment: No Appt Date: Appt Time: Unknown Provider: Reason: Details: Outcome:

## 2013-05-18 NOTE — Telephone Encounter (Signed)
On schedule for today.

## 2013-05-18 NOTE — Telephone Encounter (Signed)
Message left notifying patient.

## 2013-05-18 NOTE — Progress Notes (Addendum)
  Subjective:    Patient ID: Henry Baldwin, male    DOB: 1951-11-04, 62 y.o.   MRN: [id number]  HPI CC: tick bite  At home blood pressure runs normal.  Today somewhat elevated.  Taking aleve for R elbow discomfort present for last 2-3 weeks.  Having pain with certain overhead movements.  Points to medial and posterior elbow as site of pain.  Aleve helps.  Deer tick bite - noted yesterday.  Possibly present for 2 months.  Unsure how long present.  Removed completely yesterday.  Had surrounding ring of redness yesterday. Denies recent fever/chills, headaches, other rashes, nausea/vomiting, abd pain. + arthralgia (see above)  No past medical history on file.   Review of Systems Per HPI    Objective:   Physical Exam  Nursing note and vitals reviewed. Constitutional: He appears well-developed and well-nourished. No distress.  Musculoskeletal:  R elbow unable to completely extend at elbow, some stiffness at end extension. No pain at olecranon bursa but some puffiness posterior elbow. No pain at lat/medial epicondyle.  Skin: Skin is warm and dry. Rash noted.  Faint local erythema around tick bite No retained tick part in bite.  Healing well. No other rashes present       Assessment & Plan:

## 2013-05-18 NOTE — Patient Instructions (Addendum)
Tick bite's looking ok. Let's place you on 3 wk course of doxycycline given the elbow pain you're having. May continue aleve as needed - but drink plenty of water with it and take with food. If not better, return to see m.e Lyme Disease You may have been bitten by a tick and are to watch for the development of Lyme Disease. Lyme Disease is an infection that is caused by a bacteria The bacteria causing this disease is named Borreilia burgdorferi. If a tick is infected with this bacteria and then bites you, then Lyme Disease may occur. These ticks are carried by deer and rodents such as rabbits and mice and infest grassy as well as forested areas. Fortunately most tick bites do not cause Lyme Disease.  Lyme Disease is easier to prevent than to treat. First, covering your legs with clothing when walking in areas where ticks are possibly abundant will prevent their attachment because ticks tend to stay within inches of the ground. Second, using insecticides containing DEET can be applied on skin or clothing. Last, because it takes about 12 to 24 hours for the tick to transmit the disease after attachment to the human host, you should inspect your body for ticks twice a day when you are in areas where Lyme Disease is common. You must look thoroughly when searching for ticks. The Ixodes tick that carries Lyme Disease is very small. It is around the size of a sesame seed (picture of tick is not actual size). Removal is best done by grasping the tick by the head and pulling it out. Do not to squeeze the body of the tick. This could inject the infecting bacteria into the bite site. Wash the area of the bite with an antiseptic solution after removal.  Lyme Disease is a disease that may affect many body systems. Because of the small size of the biting tick, most people do not notice being bitten. The first sign of an infection is usually a round red rash that extends out from the center of the tick bite. The center of  the lesion may be blood colored (hemorrhagic) or have tiny blisters (vesicular). Most lesions have bright red outer borders and partial central clearing. This rash may extend out many inches in diameter, and multiple lesions may be present. Other symptoms such as fatigue, headaches, chills and fever, general achiness and swelling of lymph glands may also occur. If this first stage of the disease is left untreated, these symptoms may gradually resolve by themselves, or progressive symptoms may occur because of spread of infection to other areas of the body.  Follow up with your caregiver to have testing and treatment if you have a tick bite and you develop any of the above complaints. Your caregiver may recommend preventative (prophylactic) medications which kill bacteria (antibiotics). Once a diagnosis of Lyme Disease is made, antibiotic treatment is highly likely to cure the disease. Effective treatment of late stage Lyme Disease may require longer courses of antibiotic therapy.  MAKE SURE YOU:   Understand these instructions.  Will watch your condition.  Will get help right away if you are not doing well or get worse. Document Released: 03/24/2001 Document Revised: 03/09/2012 Document Reviewed: 05/26/2009 [facility] Patient Information 2013 [place], [place].

## 2013-05-18 NOTE — Telephone Encounter (Signed)
Please notify doxycycline will make him more prone to sunburns so to be careful with sun exposure next 3 weeks.

## 2013-05-18 NOTE — Assessment & Plan Note (Addendum)
After tick attached of unsure duration. Rash not consistent with erythema migrans, however does have unexplained R elbow arthralgia (not consistent with lat/medial epidcondylitis or olec bursitis). Will cover for possible lyme disease - if not improved, will return for confirmatory blood work and elbow xray.

## 2013-12-30 DIAGNOSIS — N281 Cyst of kidney, acquired: Secondary | ICD-10-CM

## 2013-12-30 HISTORY — DX: Cyst of kidney, acquired: N28.1

## 2014-02-27 DIAGNOSIS — N200 Calculus of kidney: Secondary | ICD-10-CM

## 2014-02-27 HISTORY — DX: Calculus of kidney: N20.0

## 2014-03-04 ENCOUNTER — Ambulatory Visit (INDEPENDENT_AMBULATORY_CARE_PROVIDER_SITE_OTHER)
Admission: RE | Admit: 2014-03-04 | Discharge: 2014-03-04 | Disposition: A | Discharge status: Home / Self Care | Payer: BC Managed Care – PPO | Source: Ambulatory Visit | Attending: Internal Medicine | Admitting: Internal Medicine

## 2014-03-04 ENCOUNTER — Ambulatory Visit (INDEPENDENT_AMBULATORY_CARE_PROVIDER_SITE_OTHER): Payer: BC Managed Care – PPO | Admitting: Internal Medicine

## 2014-03-04 ENCOUNTER — Other Ambulatory Visit: Payer: Self-pay | Admitting: Internal Medicine

## 2014-03-04 ENCOUNTER — Encounter: Payer: Self-pay | Admitting: Internal Medicine

## 2014-03-04 VITALS — BP 138/86 | HR 84 | Temp 98.3°F | Wt 180.5 lb

## 2014-03-04 DIAGNOSIS — R300 Dysuria: Secondary | ICD-10-CM

## 2014-03-04 DIAGNOSIS — R319 Hematuria, unspecified: Secondary | ICD-10-CM

## 2014-03-04 DIAGNOSIS — R1031 Right lower quadrant pain: Secondary | ICD-10-CM

## 2014-03-04 DIAGNOSIS — N200 Calculus of kidney: Secondary | ICD-10-CM

## 2014-03-04 LAB — POCT URINALYSIS DIPSTICK
Bilirubin, UA: NEGATIVE
Glucose, UA: NEGATIVE
Ketones, UA: NEGATIVE
Leukocytes, UA: NEGATIVE
Nitrite, UA: NEGATIVE
Protein, UA: NORMAL
Spec Grav, UA: 1.01
Urobilinogen, UA: 0.2
pH, UA: 7

## 2014-03-04 MED ORDER — TAMSULOSIN HCL 0.4 MG PO CAPS: 0.4000 mg | ORAL_CAPSULE | Freq: Every day | ORAL | Status: DC

## 2014-03-04 MED ORDER — HYDROCODONE-ACETAMINOPHEN 5-325 MG PO TABS: 1.0000 | ORAL_TABLET | Freq: Four times a day (QID) | ORAL | Status: DC | PRN

## 2014-03-04 NOTE — Addendum Note (Signed)
Addended by: Lurlean Nanny on: 03/04/2014 09:49 AM   Modules accepted: Orders

## 2014-03-04 NOTE — Patient Instructions (Addendum)
Kidney Stones  Kidney stones (urolithiasis) are deposits that form inside your kidneys. The intense pain is caused by the stone moving through the urinary tract. When the stone moves, the ureter goes into spasm around the stone. The stone is usually passed in the urine.   CAUSES   · A disorder that makes certain neck glands produce too much parathyroid hormone (primary hyperparathyroidism).  · A buildup of uric acid crystals, similar to gout in your joints.  · Narrowing (stricture) of the ureter.  · A kidney obstruction present at birth (congenital obstruction).  · Previous surgery on the kidney or ureters.  · Numerous kidney infections.  SYMPTOMS   · Feeling sick to your stomach (nauseous).  · Throwing up (vomiting).  · Blood in the urine (hematuria).  · Pain that usually spreads (radiates) to the groin.  · Frequency or urgency of urination.  DIAGNOSIS   · Taking a history and physical exam.  · Blood or urine tests.  · CT scan.  · Occasionally, an examination of the inside of the urinary bladder (cystoscopy) is performed.  TREATMENT   · Observation.  · Increasing your fluid intake.  · Extracorporeal shock wave lithotripsy This is a noninvasive procedure that uses shock waves to break up kidney stones.  · Surgery may be needed if you have severe pain or persistent obstruction. There are various surgical procedures. Most of the procedures are performed with the use of small instruments. Only small incisions are needed to accommodate these instruments, so recovery time is minimized.  The size, location, and chemical composition are all important variables that will determine the proper choice of action for you. Talk to your health care provider to better understand your situation so that you will minimize the risk of injury to yourself and your kidney.   HOME CARE INSTRUCTIONS   · Drink enough water and fluids to keep your urine clear or pale yellow. This will help you to pass the stone or stone fragments.  · Strain  all urine through the provided strainer. Keep all particulate matter and stones for your health care provider to see. The stone causing the pain may be as small as a grain of salt. It is very important to use the strainer each and every time you pass your urine. The collection of your stone will allow your health care provider to analyze it and verify that a stone has actually passed. The stone analysis will often identify what you can do to reduce the incidence of recurrences.  · Only take over-the-counter or prescription medicines for pain, discomfort, or fever as directed by your health care provider.  · Make a follow-up appointment with your health care provider as directed.  · Get follow-up X-rays if required. The absence of pain does not always mean that the stone has passed. It may have only stopped moving. If the urine remains completely obstructed, it can cause loss of kidney function or even complete destruction of the kidney. It is your responsibility to make sure X-rays and follow-ups are completed. Ultrasounds of the kidney can show blockages and the status of the kidney. Ultrasounds are not associated with any radiation and can be performed easily in a matter of minutes.  SEEK MEDICAL CARE IF:  · You experience pain that is progressive and unresponsive to any pain medicine you have been prescribed.  SEEK IMMEDIATE MEDICAL CARE IF:   · Pain cannot be controlled with the prescribed medicine.  · You have a fever   or shaking chills.  · The severity or intensity of pain increases over 18 hours and is not relieved by pain medicine.  · You develop a new onset of abdominal pain.  · You feel faint or pass out.  · You are unable to urinate.  MAKE SURE YOU:   · Understand these instructions.  · Will watch your condition.  · Will get help right away if you are not doing well or get worse.  Document Released: 12/16/2005 Document Revised: 08/18/2013 Document Reviewed: 05/19/2013  [employer]® Patient Information ©2014  [employer].

## 2014-03-04 NOTE — Progress Notes (Signed)
Subjective:    Patient ID: Henry Baldwin, male    DOB: Jul 23, 1951, 63 y.o.   MRN: [id number]  HPI  Pt presents to the clinic today with c/o RLQ abdominal pain. He reports this started 3 days ago. He reports it felt like cramps at first but the next day he felt like he had pulled a muscle in that area. He is having normal BM''s daily. He denies nausea, vomiting, fever or chills. He does report that his urine has changed in color. He has noticed some slight burning with urination. He has taken OTC aleve and a muscle relaxer without much relief.  Review of Systems      Past Medical History  Diagnosis Date  . HTN (hypertension)   . Hypothyroid   . DJD (degenerative joint disease), lumbar   . Other and unspecified hyperlipidemia     Borderline    Current Outpatient Prescriptions  Medication Sig Dispense Refill  . naproxen sodium (ANAPROX) 220 MG tablet Take 220 mg by mouth as needed.       No current facility-administered medications for this visit.    No Known Allergies  Family History  Problem Relation Age of Onset  . Hypertension Father   . Thyroid disease Father   . Colon cancer Mother   . Congestive Heart Failure Mother   . Alcohol abuse Brother   . Thyroid disease Sister     History   Social History  . Marital Status: Married    Spouse Name: N/A    Number of Children: 1  . Years of Education: N/A   Occupational History  . Dock Worker [employer]   Social History Main Topics  . Smoking status: Former Smoker    Quit date: 12/30/1998  . Smokeless tobacco: Not on file  . Alcohol Use: Yes     Comment: 1.5 case/beer/week  . Drug Use: No  . Sexual Activity: Not on file   Other Topics Concern  . Not on file   Social History Narrative   Married      1 child      Dock worker--[employer]     Constitutional: Denies fever, malaise, fatigue, headache or abrupt weight changes.   Gastrointestinal: Pt reports RLQ abdominal pain. Denies  bloating,  constipation, diarrhea or blood in the stool.  GU: Pt reports dysuria. Denies urgency, frequency, pain with urination, blood in urine, odor or discharge. Musculoskeletal: Denies decrease in range of motion, difficulty with gait, muscle pain or joint pain and swelling.    No other specific complaints in a complete review of systems (except as listed in HPI above).  Objective:   Physical Exam   BP 138/86  Pulse 84  Temp(Src) 98.3 F (36.8 C) (Oral)  Wt 180 lb 8 oz (81.874 kg)  SpO2 98% Wt Readings from Last 3 Encounters:  03/04/14 180 lb 8 oz (81.874 kg)  05/18/13 181 lb 8 oz (82.328 kg)  01/23/11 183 lb 12 oz (83.348 kg)    General: Appears his stated age, well developed, well nourished in NAD. Cardiovascular: Normal rate and rhythm. S1,S2 noted.  No murmur, rubs or gallops noted. No JVD or BLE edema. No carotid bruits noted. Pulmonary/Chest: Normal effort and positive vesicular breath sounds. No respiratory distress. No wheezes, rales or ronchi noted.  Abdomen: Soft and nontender. Normal bowel sounds, no bruits noted. No distention or masses noted. Liver, spleen and kidneys non palpable. CVA tenderness noted on the right. Musculoskeletal: Normal range of motion. No  signs of joint swelling. No difficulty with gait.         Assessment & Plan:   RLQ abdominal pain with change in urine color:  Urinalysis: moderate blood ? Kidney Stone Will order CT abd/pelvis without contrast  Will follow up after CT scan results

## 2014-03-04 NOTE — Progress Notes (Signed)
Pre visit review using our clinic review tool, if applicable. No additional management support is needed unless otherwise documented below in the visit note. 

## 2014-03-10 ENCOUNTER — Other Ambulatory Visit (INDEPENDENT_AMBULATORY_CARE_PROVIDER_SITE_OTHER): Payer: BC Managed Care – PPO

## 2014-03-10 DIAGNOSIS — R10A0 Flank pain, unspecified side: Secondary | ICD-10-CM

## 2014-03-10 DIAGNOSIS — R109 Unspecified abdominal pain: Secondary | ICD-10-CM

## 2014-03-15 LAB — STONE ANALYSIS: Stone Weight KSTONE: 0.047 g

## 2014-03-31 ENCOUNTER — Ambulatory Visit (INDEPENDENT_AMBULATORY_CARE_PROVIDER_SITE_OTHER): Payer: BC Managed Care – PPO

## 2014-03-31 DIAGNOSIS — Z23 Encounter for immunization: Secondary | ICD-10-CM

## 2014-07-23 ENCOUNTER — Other Ambulatory Visit: Payer: Self-pay | Admitting: Internal Medicine

## 2014-07-25 NOTE — Telephone Encounter (Signed)
Last filled 03/04/2014 with 3 refills--last OV with you 05/18/13 with no upcoming appts--please advise

## 2014-10-05 ENCOUNTER — Ambulatory Visit (INDEPENDENT_AMBULATORY_CARE_PROVIDER_SITE_OTHER): Payer: Commercial Managed Care - HMO

## 2014-10-05 DIAGNOSIS — Z23 Encounter for immunization: Secondary | ICD-10-CM

## 2014-12-30 DIAGNOSIS — I6529 Occlusion and stenosis of unspecified carotid artery: Secondary | ICD-10-CM

## 2014-12-30 HISTORY — DX: Occlusion and stenosis of unspecified carotid artery: I65.29

## 2015-03-18 ENCOUNTER — Encounter: Payer: Self-pay | Admitting: Family Medicine

## 2015-03-18 ENCOUNTER — Other Ambulatory Visit: Payer: Self-pay | Admitting: Family Medicine

## 2015-03-18 DIAGNOSIS — I10 Essential (primary) hypertension: Secondary | ICD-10-CM

## 2015-03-18 DIAGNOSIS — E039 Hypothyroidism, unspecified: Secondary | ICD-10-CM

## 2015-03-18 DIAGNOSIS — E785 Hyperlipidemia, unspecified: Secondary | ICD-10-CM

## 2015-03-18 DIAGNOSIS — Z125 Encounter for screening for malignant neoplasm of prostate: Secondary | ICD-10-CM

## 2015-03-18 DIAGNOSIS — R739 Hyperglycemia, unspecified: Secondary | ICD-10-CM

## 2015-03-18 DIAGNOSIS — N200 Calculus of kidney: Secondary | ICD-10-CM

## 2015-03-23 ENCOUNTER — Other Ambulatory Visit (INDEPENDENT_AMBULATORY_CARE_PROVIDER_SITE_OTHER): Payer: PPO

## 2015-03-23 DIAGNOSIS — Z125 Encounter for screening for malignant neoplasm of prostate: Secondary | ICD-10-CM | POA: Diagnosis not present

## 2015-03-23 DIAGNOSIS — E785 Hyperlipidemia, unspecified: Secondary | ICD-10-CM | POA: Diagnosis not present

## 2015-03-23 DIAGNOSIS — I10 Essential (primary) hypertension: Secondary | ICD-10-CM | POA: Diagnosis not present

## 2015-03-23 DIAGNOSIS — R739 Hyperglycemia, unspecified: Secondary | ICD-10-CM

## 2015-03-23 DIAGNOSIS — E039 Hypothyroidism, unspecified: Secondary | ICD-10-CM

## 2015-03-23 LAB — COMPREHENSIVE METABOLIC PANEL WITH GFR
ALT: 15 U/L (ref 0–53)
AST: 21 U/L (ref 0–37)
Albumin: 4.2 g/dL (ref 3.5–5.2)
Alkaline Phosphatase: 66 U/L (ref 39–117)
BUN: 10 mg/dL (ref 6–23)
CO2: 27 meq/L (ref 19–32)
Calcium: 9.1 mg/dL (ref 8.4–10.5)
Chloride: 103 meq/L (ref 96–112)
Creatinine, Ser: 0.87 mg/dL (ref 0.40–1.50)
GFR: 93.43 mL/min
Glucose, Bld: 113 mg/dL — ABNORMAL HIGH (ref 70–99)
Potassium: 4.7 meq/L (ref 3.5–5.1)
Sodium: 135 meq/L (ref 135–145)
Total Bilirubin: 0.7 mg/dL (ref 0.2–1.2)
Total Protein: 6.9 g/dL (ref 6.0–8.3)

## 2015-03-23 LAB — LIPID PANEL
Cholesterol: 250 mg/dL — ABNORMAL HIGH (ref 0–200)
HDL: 55.8 mg/dL
LDL Cholesterol: 170 mg/dL — ABNORMAL HIGH (ref 0–99)
NonHDL: 194.2
Total CHOL/HDL Ratio: 4
Triglycerides: 120 mg/dL (ref 0.0–149.0)
VLDL: 24 mg/dL (ref 0.0–40.0)

## 2015-03-23 LAB — PSA, MEDICARE: PSA: 0.63 ng/mL (ref 0.10–4.00)

## 2015-03-23 LAB — T4, FREE: Free T4: 0.69 ng/dL (ref 0.60–1.60)

## 2015-03-23 LAB — TSH: TSH: 7.69 u[IU]/mL — ABNORMAL HIGH (ref 0.35–4.50)

## 2015-03-28 ENCOUNTER — Telehealth: Payer: Self-pay | Admitting: Family Medicine

## 2015-03-28 ENCOUNTER — Encounter: Payer: Self-pay | Admitting: Family Medicine

## 2015-03-28 ENCOUNTER — Ambulatory Visit (INDEPENDENT_AMBULATORY_CARE_PROVIDER_SITE_OTHER): Payer: PPO | Admitting: Family Medicine

## 2015-03-28 VITALS — BP 124/80 | HR 59 | Temp 97.7°F | Ht 68.0 in | Wt 178.8 lb

## 2015-03-28 DIAGNOSIS — Z789 Other specified health status: Secondary | ICD-10-CM

## 2015-03-28 DIAGNOSIS — N529 Male erectile dysfunction, unspecified: Secondary | ICD-10-CM | POA: Insufficient documentation

## 2015-03-28 DIAGNOSIS — R0989 Other specified symptoms and signs involving the circulatory and respiratory systems: Secondary | ICD-10-CM

## 2015-03-28 DIAGNOSIS — Z136 Encounter for screening for cardiovascular disorders: Secondary | ICD-10-CM

## 2015-03-28 DIAGNOSIS — R739 Hyperglycemia, unspecified: Secondary | ICD-10-CM

## 2015-03-28 DIAGNOSIS — Z23 Encounter for immunization: Secondary | ICD-10-CM | POA: Diagnosis not present

## 2015-03-28 DIAGNOSIS — N281 Cyst of kidney, acquired: Secondary | ICD-10-CM | POA: Insufficient documentation

## 2015-03-28 DIAGNOSIS — F1099 Alcohol use, unspecified with unspecified alcohol-induced disorder: Secondary | ICD-10-CM

## 2015-03-28 DIAGNOSIS — Z87891 Personal history of nicotine dependence: Secondary | ICD-10-CM

## 2015-03-28 DIAGNOSIS — N200 Calculus of kidney: Secondary | ICD-10-CM | POA: Diagnosis not present

## 2015-03-28 DIAGNOSIS — F1090 Alcohol use, unspecified, uncomplicated: Secondary | ICD-10-CM

## 2015-03-28 DIAGNOSIS — Z0000 Encounter for general adult medical examination without abnormal findings: Secondary | ICD-10-CM | POA: Diagnosis not present

## 2015-03-28 DIAGNOSIS — E039 Hypothyroidism, unspecified: Secondary | ICD-10-CM

## 2015-03-28 DIAGNOSIS — Z7289 Other problems related to lifestyle: Secondary | ICD-10-CM | POA: Insufficient documentation

## 2015-03-28 DIAGNOSIS — E038 Other specified hypothyroidism: Secondary | ICD-10-CM

## 2015-03-28 DIAGNOSIS — I10 Essential (primary) hypertension: Secondary | ICD-10-CM

## 2015-03-28 DIAGNOSIS — E785 Hyperlipidemia, unspecified: Secondary | ICD-10-CM

## 2015-03-28 DIAGNOSIS — Z7189 Other specified counseling: Secondary | ICD-10-CM

## 2015-03-28 LAB — POCT URINALYSIS DIPSTICK
Bilirubin, UA: NEGATIVE
Blood, UA: NEGATIVE
Glucose, UA: NEGATIVE
Ketones, UA: NEGATIVE
Leukocytes, UA: NEGATIVE
Nitrite, UA: NEGATIVE
Protein, UA: NEGATIVE
Spec Grav, UA: 1.02
Urobilinogen, UA: NEGATIVE
pH, UA: 6.5

## 2015-03-28 NOTE — Assessment & Plan Note (Signed)
Strongly increased moderation in alcohol intake, discussed detrimental effects of continued drinking. Pt not inclined to change alcohol habits.

## 2015-03-28 NOTE — Assessment & Plan Note (Signed)
Continue to monitor, recheck in 4 mo with TFTs

## 2015-03-28 NOTE — Assessment & Plan Note (Signed)
Check renal US today to f/u last year's renal cyst finding.

## 2015-03-28 NOTE — Addendum Note (Signed)
Addended by: Ria Bush on: 03/28/2015 11:03 AM   Modules accepted: Level of Service, SmartSet

## 2015-03-28 NOTE — Assessment & Plan Note (Signed)
Discussed implications of finding today - will check carotid duplex.

## 2015-03-28 NOTE — Telephone Encounter (Signed)
emmi emailed °

## 2015-03-28 NOTE — Assessment & Plan Note (Signed)
Reviewed #s with patient and wife, discussed goal LDL <100 given presumed PVD. Provided with low chol diet handout. Work on [employer] for 4 months and recheck levels at f/u visit.

## 2015-03-28 NOTE — Assessment & Plan Note (Addendum)
Reviewed #s with patient. Encouraged avoiding added sugars. Discussed concern for prediabetes.

## 2015-03-28 NOTE — Assessment & Plan Note (Addendum)
I have personally reviewed the Medicare Annual Wellness questionnaire and have noted 1. The patient's medical and social history 2. Their use of alcohol, tobacco or illicit drugs 3. Their current medications and supplements 4. The patient's functional ability including ADL's, fall risks, home safety risks and hearing or visual impairment. 5. Diet and physical activity 6. Evidence for depression or mood disorders The patients weight, height, BMI have been recorded in the chart.  Hearing and vision has been addressed. I have made referrals, counseling and provided education to the patient based review of the above and I have provided the pt with a written personalized care plan for preventive services. Provider list updated - see scanned questionairre. Reviewed preventative protocols and updated unless pt declined.   EKG - sinus bradycardia 50s, normal axis, intervals, no acute ST/T changes.

## 2015-03-28 NOTE — Assessment & Plan Note (Signed)
Congratulated on smoking cessation.  

## 2015-03-28 NOTE — Patient Instructions (Addendum)
prevnar today. Urinalysis today.  EKG today, we will schedule abdominal ultrasound for screening for aneurysm given smoking history.  We will schedule kidney ultrasound for follow up cyst.  We will schedule carotid ultrasound. Call your insurance about the shingles shot to see if it is covered or how much it would cost and where is cheaper (here or pharmacy).  If you want to receive here, call for nurse visit. Bring [place] a copy of living will/advanced directive to update chart. Good to see you today. Return in 4 months for follow up thyroid function and cholesterol levels.

## 2015-03-28 NOTE — Assessment & Plan Note (Signed)
Advanced directive discussion - has at home. HCPOA is wife.  

## 2015-03-28 NOTE — Progress Notes (Signed)
Pre visit review using our clinic review tool, if applicable. No additional management support is needed unless otherwise documented below in the visit note. 

## 2015-03-28 NOTE — Assessment & Plan Note (Addendum)
Per pt persistent hematuria but without pain - recheck UA today.

## 2015-03-28 NOTE — Progress Notes (Addendum)
BP 124/80 mmHg  Pulse 59  Temp(Src) 97.7 F (36.5 C) (Oral)  Ht 5\' 8"  (1.727 m)  Wt 178 lb 12.8 oz (81.103 kg)  BMI 27.19 kg/m2  SpO2 98%   CC: welcome to medicare visit  Subjective:    Patient ID: Henry Baldwin, male    DOB: 01-08-1951, 64 y.o.   MRN: [id number]  HPI: Henry Baldwin is a 64 y.o. male presenting on 03/28/2015 for Annual Exam   Received medicare 07/2015. Presents with wife today.  Ex smoker - quit 16 yrs ago. Up to 3ppd x ~2yrs.   Seen last year for RLQ abd pain - found to have 6.79mm kidney stone.  He did pass this stone. Also found R kidney cyst. Persistent intermittent hematuria  IMPRESSION: 1. 6.3 mm calculus projecting within the distal aspect of the vesicoureteral junction on the right. There is reactive mild obstructive uropathy appreciated on the right. Nonobstructing medullary calculi are appreciated on the right. 2. Nonobstructing medullary calculus in the left kidney. 3. Dystrophic appearing calcification in the posterior aspect of the pelvis just anterior to the recto sigmoid colon region. Differential considerations include a calcified lymph node, sequela of prior trauma, prior infection,, or possibly sequela of prior granulomatous disease. A calcified duplication cyst also a diagnostic consideration. This finding has a benign appearance. 4. No further evidence of obstructive or inflammatory abnormalities. 5. Likely cyst within the anterior lower pole region of the right kidney. This finding may possibly be further characterized with renal ultrasound.  Hearing screen - uses hearing aides Vision screen - passed Fall risk screen - once fell upstairs without injury Depression screen - positive.  Preventative: COLONOSCOPY Date: 11/20/10 Normal-Dr. Henrene Pastor, rpt 5 yrs given fmhx Prostate cancer screening - discussed, [facility]x would like to screen. Lung cancer screening - quit >16 yrs ago Flu shot - 09/2014 Tetanus shot - 03/2014 prevnar today Shingles shot  - discussed. Declines.  Advanced directive discussion - has at home. HCPOA is wife.  Seat belt use discussed Sunscreen use and skin screen discussed   Married, lives with wife, new cat 1 child 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 Activity: outdoor work Diet: good water, vegetables daily  Relevant past medical, surgical, family and social history reviewed and updated as indicated. Interim medical history since our last visit reviewed. Allergies and medications reviewed and updated. No current outpatient prescriptions on file prior to visit.   No current facility-administered medications on file prior to visit.    Review of Systems  Constitutional: Negative for fever, chills, activity change, appetite change, fatigue and unexpected weight change.  HENT: Negative for hearing loss.   Eyes: Negative for visual disturbance.  Respiratory: Negative for cough, chest tightness, shortness of breath and wheezing.   Cardiovascular: Negative for chest pain, palpitations and leg swelling.  Gastrointestinal: Negative for nausea, vomiting, abdominal pain, diarrhea, constipation, blood in stool and abdominal distention.  Genitourinary: Positive for hematuria. Negative for difficulty urinating.  Musculoskeletal: Negative for myalgias, arthralgias and neck pain.  Skin: Negative for rash.  Neurological: Negative for dizziness, seizures, syncope and headaches.  Hematological: Negative for adenopathy. Does not bruise/bleed easily.  Psychiatric/Behavioral: Negative for dysphoric mood. The patient is not nervous/anxious.    Per HPI unless specifically indicated above     Objective:    BP 124/80 mmHg  Pulse 59  Temp(Src) 97.7 F (36.5 C) (Oral)  Ht 5\' 8"  (1.727 m)  Wt 178 lb 12.8 oz (81.103 kg)  BMI 27.19 kg/m2  SpO2 98%  Wt Readings  from Last 3 Encounters:  03/28/15 178 lb 12.8 oz (81.103 kg)  03/04/14 180 lb 8 oz (81.874 kg)  05/18/13 181 lb 8 oz (82.328 kg)    Physical Exam  Constitutional:  He is oriented to person, place, and time. He appears well-developed and well-nourished. No distress.  HENT:  Head: Normocephalic and atraumatic.  Right Ear: Hearing, tympanic membrane, external ear and ear canal normal.  Left Ear: Hearing, tympanic membrane, external ear and ear canal normal.  Nose: Nose normal.  Mouth/Throat: Uvula is midline, oropharynx is clear and moist and mucous membranes are normal. No oropharyngeal exudate, posterior oropharyngeal edema or posterior oropharyngeal erythema.  Eyes: Conjunctivae and EOM are normal. Pupils are equal, round, and reactive to light. No scleral icterus.  Neck: Normal range of motion. Neck supple. Carotid bruit is present (left). No thyromegaly present.  Cardiovascular: Normal rate, regular rhythm, normal heart sounds and intact distal pulses.   No murmur heard. Pulses:      Radial pulses are 2+ on the right side, and 2+ on the left side.  Pulmonary/Chest: Effort normal and breath sounds normal. No respiratory distress. He has no wheezes. He has no rales.  Abdominal: Soft. Bowel sounds are normal. He exhibits no distension and no mass. There is no tenderness. There is no rebound and no guarding.  Genitourinary: Rectum normal and prostate normal. Rectal exam shows no external hemorrhoid, no internal hemorrhoid, no fissure, no mass, no tenderness and anal tone normal. Prostate is not enlarged (15g) and not tender.  Musculoskeletal: Normal range of motion. He exhibits no edema.  Lymphadenopathy:    He has no cervical adenopathy.  Neurological: He is alert and oriented to person, place, and time.  CN grossly intact, station and gait intact Recall 3/3  Calculation 5/5 serial 7s  Skin: Skin is warm and dry. No rash noted.  Psychiatric: He has a normal mood and affect. His behavior is normal. Judgment and thought content normal.  Nursing note and vitals reviewed.  Results for orders placed or performed in visit on 03/28/15  POCT urinalysis  dipstick  Result Value Ref Range   Color, UA Yellow    Clarity, UA clear    Glucose, UA negative    Bilirubin, UA negative    Ketones, UA negative    Spec Grav, UA 1.020    Blood, UA negative    pH, UA 6.5    Protein, UA negative    Urobilinogen, UA negative    Nitrite, UA negative    Leukocytes, UA Negative       Assessment & Plan:   Problem List Items Addressed This Visit    Welcome to Medicare preventive visit - Primary    I have personally reviewed the Medicare Annual Wellness questionnaire and have noted 1. The patient's medical and social history 2. Their use of alcohol, tobacco or illicit drugs 3. Their current medications and supplements 4. The patient's functional ability including ADL's, fall risks, home safety risks and hearing or visual impairment. 5. Diet and physical activity 6. Evidence for depression or mood disorders The patients weight, height, BMI have been recorded in the chart.  Hearing and vision has been addressed. I have made referrals, counseling and provided education to the patient based review of the above and I have provided the pt with a written personalized care plan for preventive services. Provider list updated - see scanned questionairre. Reviewed preventative protocols and updated unless pt declined.   EKG - sinus bradycardia 50s, normal  axis, intervals, no acute ST/T changes.      Relevant Orders   EKG 12-Lead (Completed)   Pneumococcal conjugate vaccine 13-valent IM (Completed)   Subclinical hypothyroidism    Continue to monitor, recheck in 4 mo with TFTs      Renal cyst, acquired, right    Check renal US today to f/u last year's renal cyst finding.      Relevant Orders   US Renal   POCT urinalysis dipstick (Completed)   Nephrolithiasis    Per pt persistent hematuria but without pain - recheck UA today.      Relevant Orders   US Renal   POCT urinalysis dipstick (Completed)   Left carotid bruit    Discussed implications of  finding today - will check carotid duplex.      Relevant Orders   Carotid duplex   EKG 12-Lead (Completed)   Hyperglycemia    Reviewed #s with patient. Encouraged avoiding added sugars. Discussed concern for prediabetes.      HLD (hyperlipidemia)    Reviewed #s with patient and wife, discussed goal LDL <100 given presumed PVD. Provided with low chol diet handout. Work on [employer] for 4 months and recheck levels at f/u visit.      Relevant Medications   aspirin 325 MG tablet   Ex-smoker    Congratulated on smoking cessation.      RESOLVED: Essential hypertension    Resolved.      Relevant Medications   aspirin 325 MG tablet   Other Relevant Orders   EKG 12-Lead (Completed)   Erectile dysfunction   Alcohol use    Strongly increased moderation in alcohol intake, discussed detrimental effects of continued drinking. Pt not inclined to change alcohol habits.      Advanced care planning/counseling discussion    Advanced directive discussion - has at home. HCPOA is wife.        Other Visit Diagnoses    Screening for AAA (abdominal aortic aneurysm)        Relevant Orders    Abdominal Aortic Aneurysm duplex    Need for prophylactic vaccination against Streptococcus pneumoniae (pneumococcus)        Relevant Orders    Pneumococcal conjugate vaccine 13-valent IM (Completed)        Follow up plan: Return as needed, for follow up visit.

## 2015-03-28 NOTE — Assessment & Plan Note (Signed)
Resolved

## 2015-03-29 NOTE — Addendum Note (Signed)
Addended by: Ria Bush on: 03/29/2015 08:24 AM   Modules accepted: Level of Service, SmartSet

## 2015-04-04 ENCOUNTER — Ambulatory Visit
Admit: 2015-04-04 | Disposition: A | Discharge status: Home / Self Care | Payer: Self-pay | Attending: Family Medicine | Admitting: Family Medicine

## 2015-04-05 ENCOUNTER — Encounter: Payer: Self-pay | Admitting: Family Medicine

## 2015-04-20 ENCOUNTER — Encounter (INDEPENDENT_AMBULATORY_CARE_PROVIDER_SITE_OTHER): Payer: PPO

## 2015-04-20 DIAGNOSIS — R0989 Other specified symptoms and signs involving the circulatory and respiratory systems: Secondary | ICD-10-CM

## 2015-04-20 DIAGNOSIS — I700 Atherosclerosis of aorta: Secondary | ICD-10-CM

## 2015-04-20 DIAGNOSIS — Z136 Encounter for screening for cardiovascular disorders: Secondary | ICD-10-CM

## 2015-04-22 ENCOUNTER — Encounter: Payer: Self-pay | Admitting: Family Medicine

## 2015-04-22 DIAGNOSIS — I6529 Occlusion and stenosis of unspecified carotid artery: Secondary | ICD-10-CM | POA: Insufficient documentation

## 2015-11-13 ENCOUNTER — Encounter: Payer: Self-pay | Admitting: Internal Medicine

## 2015-12-06 ENCOUNTER — Encounter: Payer: Self-pay | Admitting: Internal Medicine

## 2015-12-10 IMAGING — US US RENAL KIDNEY
1 series · 14 of 25 positions shown · non-contrast
Comparison: None.

CLINICAL DATA: Evaluate for right renal cyst and possible
nephrolithiasis

EXAM:
RENAL/URINARY TRACT ULTRASOUND COMPLETE

[Series 1: us renal kidney · 0.28mm/px · 14 of 54 slices shown]
[im 1/54]
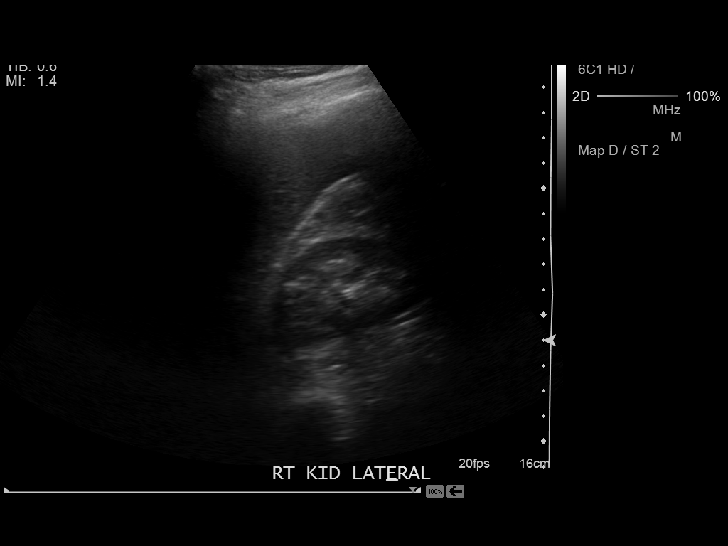
[im 5/54]
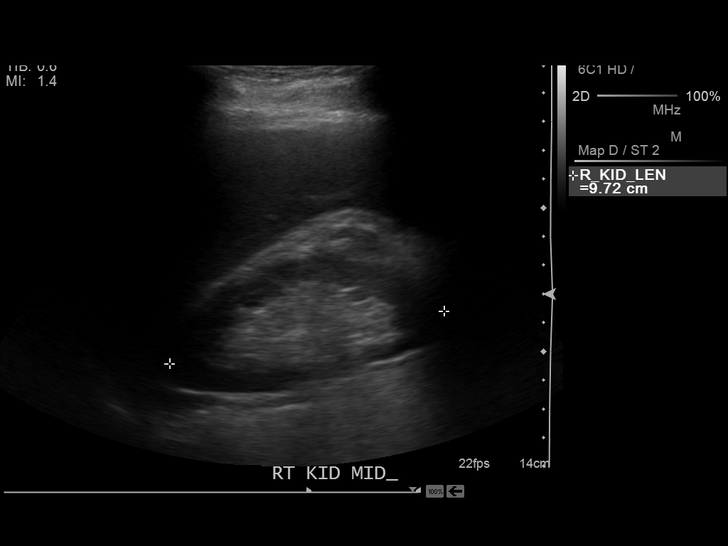
[im 9/54]
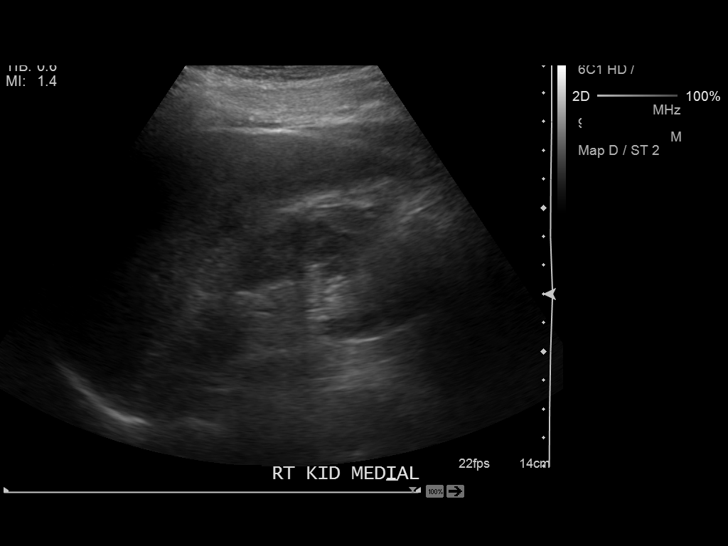
[im 14/54]
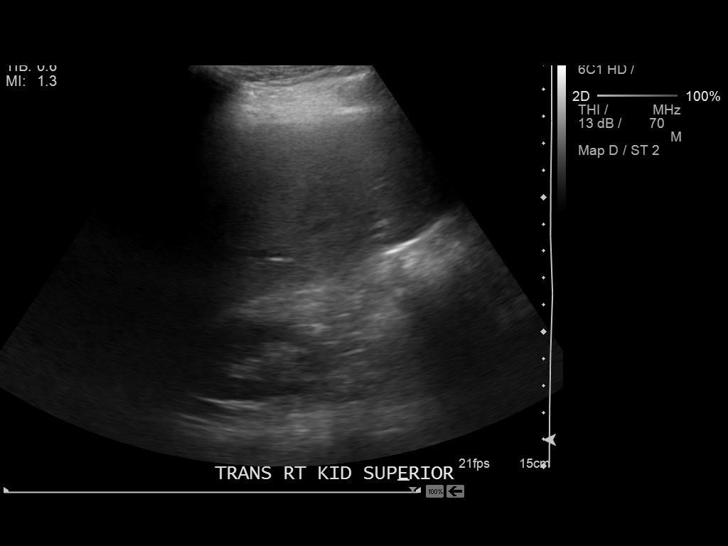
[im 18/54]
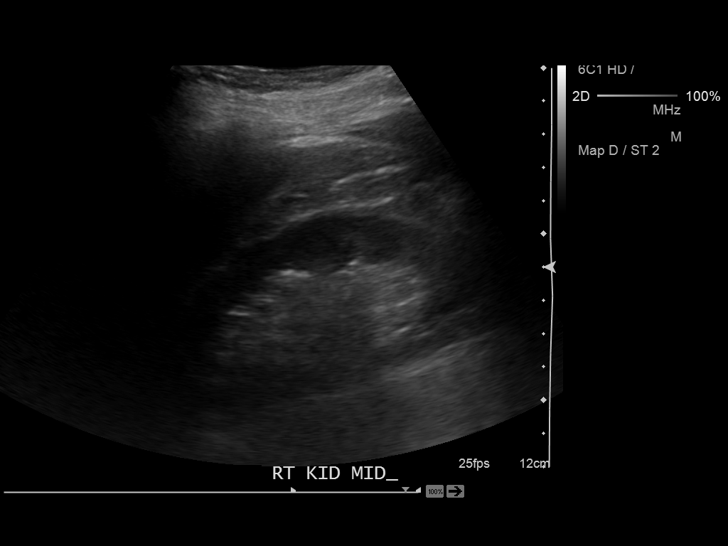
[im 20/54]
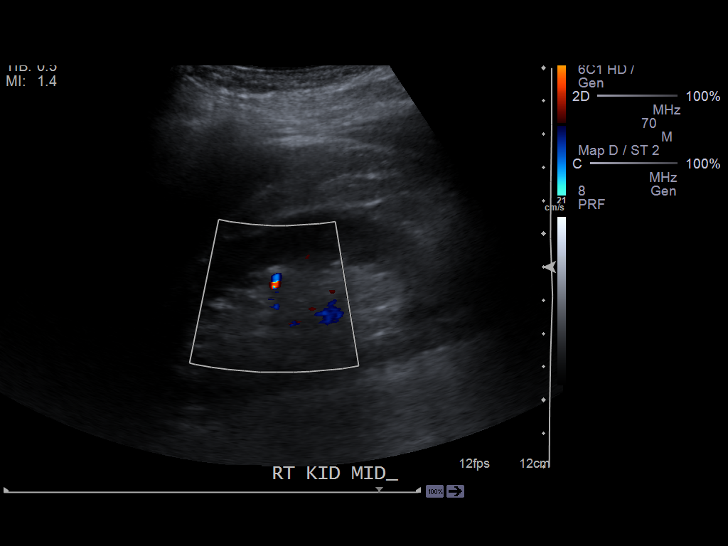
[im 25/54]
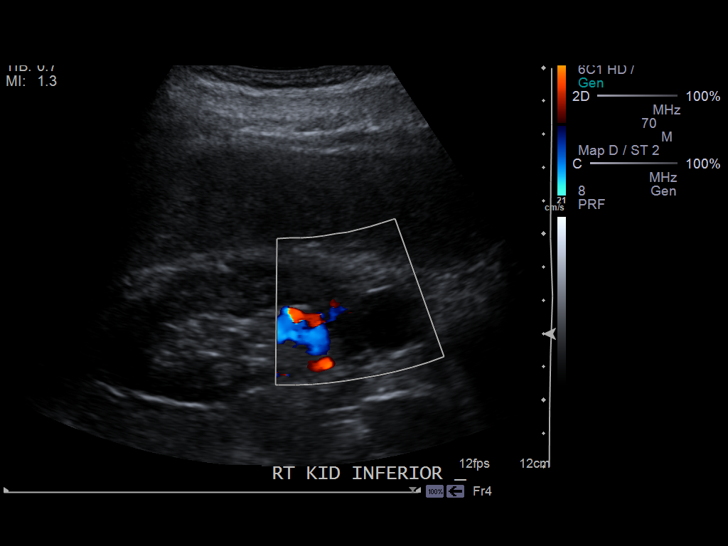
[im 29/54]
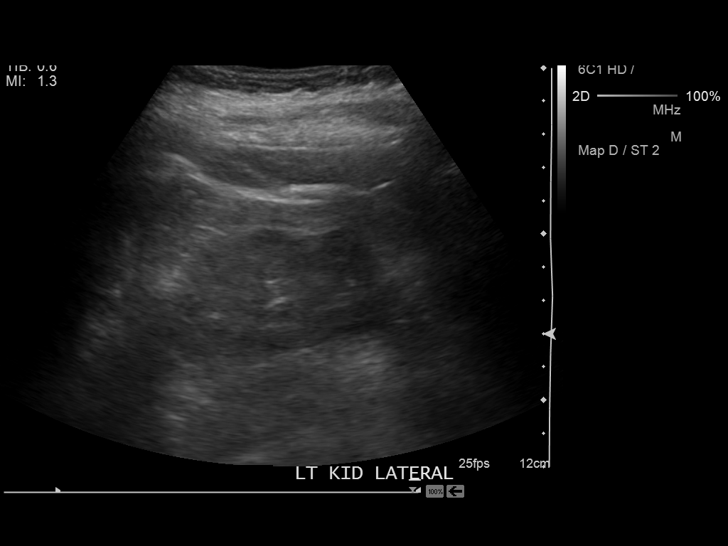
[im 34/54]
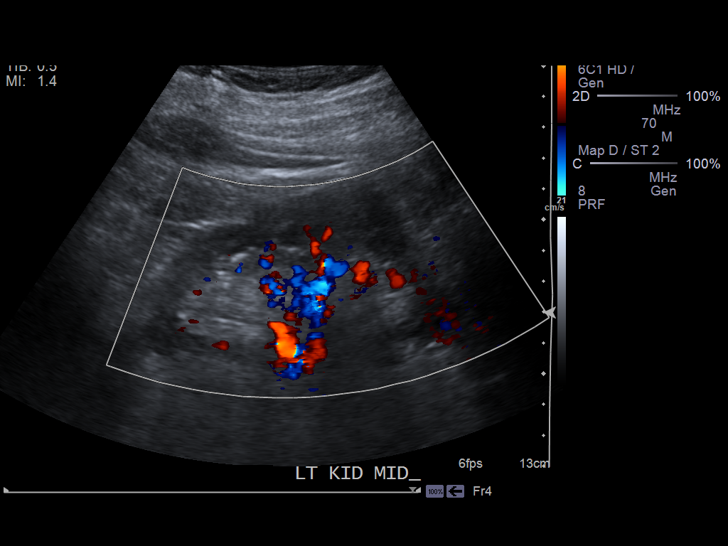
[im 36/54]
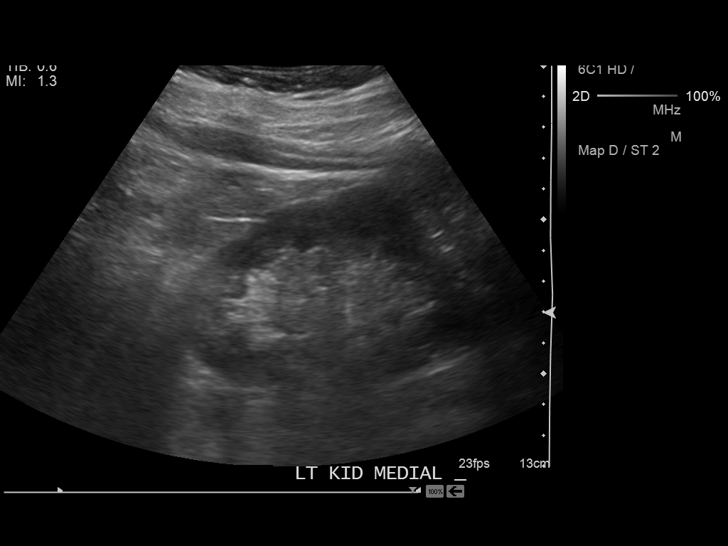
[im 40/54]
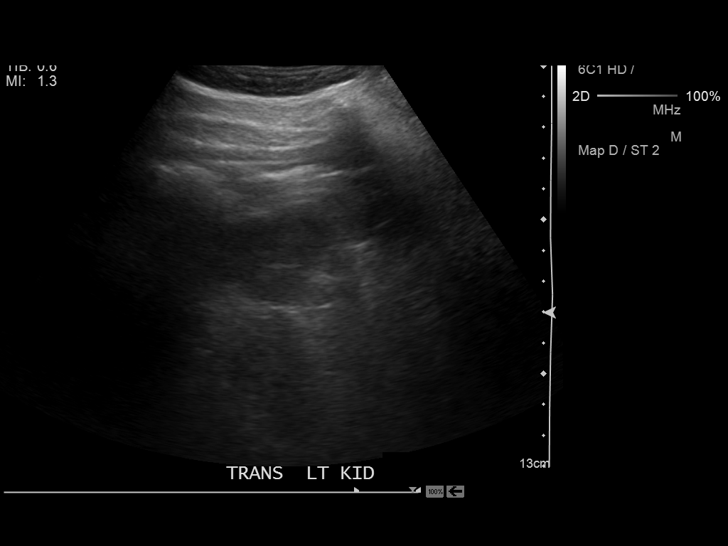
[im 45/54]
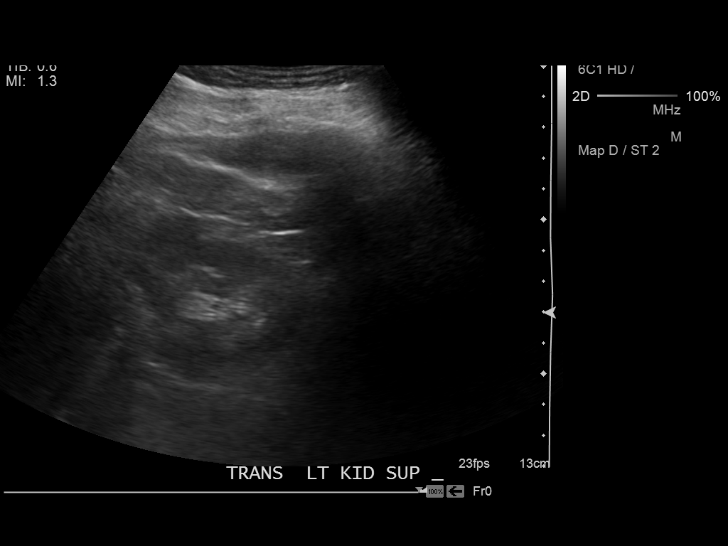
[im 49/54]
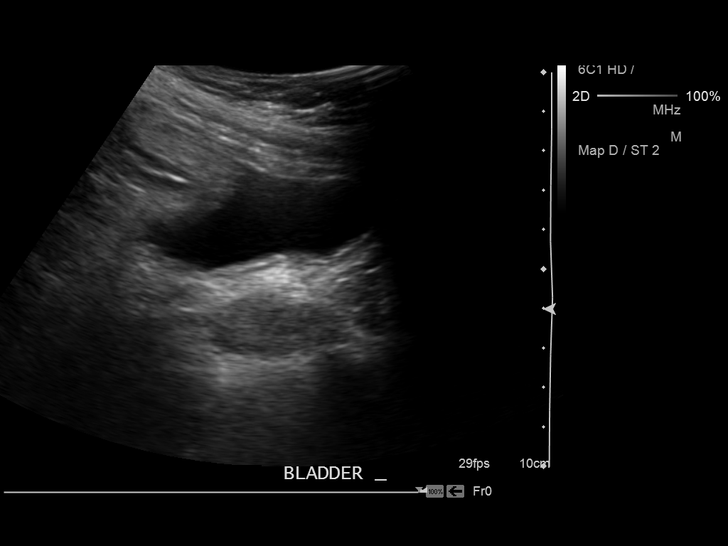
[im 54/54]
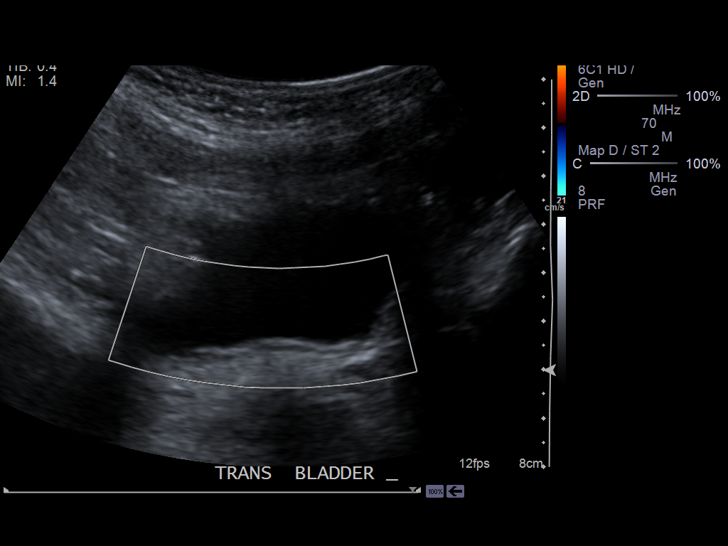

[14 of 25 positions shown; findings below may reference images not displayed]

FINDINGS: Right Kidney:

Length: 9.7 cm. Mild cortical atrophy. Normal echogenicity. No
hydronephrosis or mass. Cyst lower pole measures 18 x 19 x 21 mm.
Numerous echogenic linear foci appear more consistent with artifact
in calculi. This includes an 8 mm linear echogenic focus involving
midpole parenchyma.

Left Kidney:

Length: 9.8 cm. Normal echogenicity. Mild cortical thinning. No
hydronephrosis.

Bladder:

Appears normal for degree of bladder distention.
IMPRESSION: 2 cm cyst lower pole right kidney. Otherwise study is felt to be
normal. No convincing calculi.

## 2016-01-04 DIAGNOSIS — H2513 Age-related nuclear cataract, bilateral: Secondary | ICD-10-CM | POA: Diagnosis not present

## 2016-01-23 ENCOUNTER — Telehealth: Payer: Self-pay | Admitting: Internal Medicine

## 2016-01-23 ENCOUNTER — Ambulatory Visit (AMBULATORY_SURGERY_CENTER): Payer: Self-pay | Admitting: *Deleted

## 2016-01-23 VITALS — Ht 67.75 in | Wt 178.6 lb

## 2016-01-23 DIAGNOSIS — Z800 Family history of malignant neoplasm of digestive organs: Secondary | ICD-10-CM

## 2016-01-23 MED ORDER — NA SULFATE-K SULFATE-MG SULF 17.5-3.13-1.6 GM/177ML PO SOLN: ORAL | Status: DC

## 2016-01-23 NOTE — Telephone Encounter (Signed)
Talked with wife.  She will come by [facility] 4th floor to pick up sample 01/24/2016

## 2016-01-23 NOTE — Progress Notes (Signed)
No allergies to eggs or soy. No problems with anesthesia.  Pt given Emmi instructions for colonoscopy  No oxygen use  No diet drug use  

## 2016-01-31 HISTORY — PX: COLONOSCOPY: SHX174

## 2016-02-06 ENCOUNTER — Encounter: Payer: Self-pay | Admitting: Internal Medicine

## 2016-02-06 ENCOUNTER — Ambulatory Visit (AMBULATORY_SURGERY_CENTER): Payer: PPO | Admitting: Internal Medicine

## 2016-02-06 VITALS — BP 134/86 | HR 56 | Temp 98.6°F | Resp 23 | Ht 68.0 in | Wt 178.0 lb

## 2016-02-06 DIAGNOSIS — D122 Benign neoplasm of ascending colon: Secondary | ICD-10-CM

## 2016-02-06 DIAGNOSIS — Z1211 Encounter for screening for malignant neoplasm of colon: Secondary | ICD-10-CM

## 2016-02-06 DIAGNOSIS — Z800 Family history of malignant neoplasm of digestive organs: Secondary | ICD-10-CM

## 2016-02-06 DIAGNOSIS — I6529 Occlusion and stenosis of unspecified carotid artery: Secondary | ICD-10-CM | POA: Diagnosis not present

## 2016-02-06 DIAGNOSIS — I10 Essential (primary) hypertension: Secondary | ICD-10-CM | POA: Diagnosis not present

## 2016-02-06 MED ORDER — SODIUM CHLORIDE 0.9 % IV SOLN: 500.0000 mL | INTRAVENOUS | Status: DC

## 2016-02-06 NOTE — Progress Notes (Signed)
Patient awakening,vss,report to rn 

## 2016-02-06 NOTE — Progress Notes (Signed)
Called to room to assist during endoscopic procedure.  Patient ID and intended procedure confirmed with present staff. Received instructions for my participation in the procedure from the performing physician.  

## 2016-02-06 NOTE — Op Note (Signed)
[place]  [address] [place], [id number]   COLONOSCOPY PROCEDURE REPORT  PATIENT: Henry Baldwin, Henry Baldwin  MR#: [id number] BIRTHDATE: Dec 07, 1951 , 31  yrs. old GENDER: male ENDOSCOPIST: Eustace Quail, MD REFERRED [id number] Recall, M.D. PROCEDURE DATE:  02/06/2016 PROCEDURE:   Colonoscopy, screening and Colonoscopy with snare polypectomy x 2 First Screening Colonoscopy - Avg.  risk and is 50 yrs.  old or older - No.  Prior Negative Screening - Now for repeat screening. Less than 10 yrs Prior Negative Screening - Now for repeat screening.  Above average risk  History of Adenoma - Now for follow-up colonoscopy & has been > or = to 3 yrs.  N/A  Polyps removed today? Yes ASA CLASS:   Class II INDICATIONS:Screening for colonic neoplasia and FH Colon or Rectal Adenocarcinoma. At at 22. Prior examinations 2003 (polyp) 2005 (negative), 2011 (negative). MEDICATIONS: Monitored anesthesia care and Propofol 350 mg IV  DESCRIPTION OF PROCEDURE:   After the risks benefits and alternatives of the procedure were thoroughly explained, informed consent was obtained.  The digital rectal exam revealed no abnormalities of the rectum.   The LB [id number]  endoscope was introduced through the anus and advanced to the cecum, which was identified by both the appendix and ileocecal valve. No adverse events experienced.   The quality of the prep was excellent. (Suprep was used)  The instrument was then slowly withdrawn as the colon was fully examined. Estimated blood loss is zero unless otherwise noted in this procedure report.  COLON FINDINGS: Two polyps ranging from 1 to 35mm in size were found in the ascending colon.  A polypectomy was performed with a cold snare.  The resection was complete, the polyp tissue was completely retrieved and sent to histology.   There was moderate diverticulosis noted in the sigmoid colon.   The examination was otherwise normal.  Retroflexed views  revealed internal hemorrhoids. The time to cecum = 2.7 Withdrawal time = 12.9   The scope was withdrawn and the procedure completed. COMPLICATIONS: There were no immediate complications.  ENDOSCOPIC IMPRESSION: 1.   Two polyps were found in the ascending colon; polypectomy was performed with a cold snare 2.   Moderate diverticulosis was noted in the sigmoid colon 3.   The examination was otherwise normal  RECOMMENDATIONS: 1. Follow up colonoscopy in 5 years  eSigned:  Eustace Quail, MD 02/06/2016 2:05 PM   cc: The Patient and Ria Bush MD

## 2016-02-06 NOTE — Patient Instructions (Addendum)
YOU HAD AN ENDOSCOPIC PROCEDURE TODAY AT [facility]:   Refer to the procedure report that was given to you for any specific questions about what was found during the examination.  If the procedure report does not answer your questions, please call your gastroenterologist to clarify.  If you requested that your care partner not be given the details of your procedure findings, then the procedure report has been included in a sealed envelope for you to review at your convenience later.  YOU SHOULD EXPECT: Some feelings of bloating in the abdomen. Passage of more gas than usual.  Walking can help get rid of the air that was put into your GI tract during the procedure and reduce the bloating. If you had a lower endoscopy (such as a colonoscopy or flexible sigmoidoscopy) you may notice spotting of blood in your stool or on the toilet paper. If you underwent a bowel prep for your procedure, you may not have a normal bowel movement for a few days.  Please Note:  You might notice some irritation and congestion in your nose or some drainage.  This is from the oxygen used during your procedure.  There is no need for concern and it should clear up in a day or so.  SYMPTOMS TO REPORT IMMEDIATELY:   Following lower endoscopy (colonoscopy or flexible sigmoidoscopy):  Excessive amounts of blood in the stool  Significant tenderness or worsening of abdominal pains  Swelling of the abdomen that is new, acute  Fever of 100F or higher      SIGNATURES/CONFIDENTIALITY: You and/or your care partner have signed paperwork which will be entered into your electronic medical record.  These signatures attest to the fact that that the information above on your After Visit Summary has been reviewed and is understood.  Full responsibility of the confidentiality of this discharge information lies with you and/or your care-partner.  Polyp/Diverticulosis handout given Repeat Colonoscopy in 5 yearsYOU HAD AN  ENDOSCOPIC PROCEDURE TODAY AT [facility]:   Refer to the procedure report that was given to you for any specific questions about what was found during the examination.  If the procedure report does not answer your questions, please call your gastroenterologist to clarify.  If you requested that your care partner not be given the details of your procedure findings, then the procedure report has been included in a sealed envelope for you to review at your convenience later.  YOU SHOULD EXPECT: Some feelings of bloating in the abdomen. Passage of more gas than usual.  Walking can help get rid of the air that was put into your GI tract during the procedure and reduce the bloating. If you had a lower endoscopy (such as a colonoscopy or flexible sigmoidoscopy) you may notice spotting of blood in your stool or on the toilet paper. If you underwent a bowel prep for your procedure, you may not have a normal bowel movement for a few days.  Please Note:  You might notice some irritation and congestion in your nose or some drainage.  This is from the oxygen used during your procedure.  There is no need for concern and it should clear up in a day or so.  SYMPTOMS TO REPORT IMMEDIATELY:   Following lower endoscopy (colonoscopy or flexible sigmoidoscopy):  Excessive amounts of blood in the stool  Significant tenderness or worsening of abdominal pains  Swelling of the abdomen that is new, acute  Fever of 100F or higher  For urgent  or emergent issues, a gastroenterologist can be reached at any hour by calling [phone].   DIET: Your first meal following the procedure should be a small meal and then it is ok to progress to your normal diet. Heavy or fried foods are harder to digest and may make you feel nauseous or bloated.  Likewise, meals heavy in dairy and vegetables can increase bloating.  Drink plenty of fluids but you should avoid alcoholic beverages for 24 hours.  ACTIVITY:  You  should plan to take it easy for the rest of today and you should NOT DRIVE or use heavy machinery until tomorrow (because of the sedation medicines used during the test).    FOLLOW UP: Our staff will call the number listed on your records the next business day following your procedure to check on you and address any questions or concerns that you may have regarding the information given to you following your procedure. If we do not reach you, we will leave a message.  However, if you are feeling well and you are not experiencing any problems, there is no need to return our call.  We will assume that you have returned to your regular daily activities without incident.  If any biopsies were taken you will be contacted by phone or by letter within the next 1-3 weeks.  Please call us at [phone] if you have not heard about the biopsies in 3 weeks.    SIGNATURES/CONFIDENTIALITY: You and/or your care partner have signed paperwork which will be entered into your electronic medical record.  These signatures attest to the fact that that the information above on your After Visit Summary has been reviewed and is understood.  Full responsibility of the confidentiality of this discharge information lies with you and/or your care-partner.

## 2016-02-07 ENCOUNTER — Telehealth: Payer: Self-pay | Admitting: Emergency Medicine

## 2016-02-07 ENCOUNTER — Encounter: Payer: Self-pay | Admitting: Family Medicine

## 2016-02-07 NOTE — Telephone Encounter (Signed)
  Follow up Call-  Call back number 02/06/2016  Post procedure Call Back phone  # [phone]  Permission to leave phone message Yes     Patient questions:  Do you have a fever, pain , or abdominal swelling? No. Pain Score  0 *  Have you tolerated food without any problems? Yes.    Have you been able to return to your normal activities? Yes.    Do you have any questions about your discharge instructions: Diet   No. Medications  No. Follow up visit  No.  Do you have questions or concerns about your Care? No.  Actions: * If pain score is 4 or above: No action needed, pain <4.

## 2016-02-13 ENCOUNTER — Encounter: Payer: Self-pay | Admitting: Internal Medicine

## 2016-02-14 ENCOUNTER — Encounter: Payer: Self-pay | Admitting: Family Medicine

## 2016-05-12 ENCOUNTER — Other Ambulatory Visit: Payer: Self-pay | Admitting: Family Medicine

## 2016-05-12 DIAGNOSIS — Z1159 Encounter for screening for other viral diseases: Secondary | ICD-10-CM

## 2016-05-12 DIAGNOSIS — Z125 Encounter for screening for malignant neoplasm of prostate: Secondary | ICD-10-CM

## 2016-05-12 DIAGNOSIS — E038 Other specified hypothyroidism: Secondary | ICD-10-CM

## 2016-05-12 DIAGNOSIS — E039 Hypothyroidism, unspecified: Secondary | ICD-10-CM

## 2016-05-12 DIAGNOSIS — R739 Hyperglycemia, unspecified: Secondary | ICD-10-CM

## 2016-05-12 DIAGNOSIS — E785 Hyperlipidemia, unspecified: Secondary | ICD-10-CM

## 2016-05-15 ENCOUNTER — Other Ambulatory Visit (INDEPENDENT_AMBULATORY_CARE_PROVIDER_SITE_OTHER): Payer: PPO

## 2016-05-15 ENCOUNTER — Ambulatory Visit (INDEPENDENT_AMBULATORY_CARE_PROVIDER_SITE_OTHER): Payer: PPO

## 2016-05-15 VITALS — BP 122/78 | HR 58 | Temp 97.5°F | Ht 68.0 in | Wt 175.2 lb

## 2016-05-15 DIAGNOSIS — E039 Hypothyroidism, unspecified: Secondary | ICD-10-CM

## 2016-05-15 DIAGNOSIS — E038 Other specified hypothyroidism: Secondary | ICD-10-CM

## 2016-05-15 DIAGNOSIS — E785 Hyperlipidemia, unspecified: Secondary | ICD-10-CM

## 2016-05-15 DIAGNOSIS — Z0000 Encounter for general adult medical examination without abnormal findings: Secondary | ICD-10-CM | POA: Diagnosis not present

## 2016-05-15 DIAGNOSIS — R739 Hyperglycemia, unspecified: Secondary | ICD-10-CM

## 2016-05-15 DIAGNOSIS — Z125 Encounter for screening for malignant neoplasm of prostate: Secondary | ICD-10-CM

## 2016-05-15 DIAGNOSIS — Z1159 Encounter for screening for other viral diseases: Secondary | ICD-10-CM | POA: Diagnosis not present

## 2016-05-15 LAB — LIPID PANEL
Cholesterol: 267 mg/dL — ABNORMAL HIGH (ref 0–200)
HDL: 53.4 mg/dL
LDL Cholesterol: 193 mg/dL — ABNORMAL HIGH (ref 0–99)
NonHDL: 214
Total CHOL/HDL Ratio: 5
Triglycerides: 106 mg/dL (ref 0.0–149.0)
VLDL: 21.2 mg/dL (ref 0.0–40.0)

## 2016-05-15 LAB — COMPREHENSIVE METABOLIC PANEL WITH GFR
ALT: 11 U/L (ref 0–53)
AST: 15 U/L (ref 0–37)
Albumin: 4.2 g/dL (ref 3.5–5.2)
Alkaline Phosphatase: 58 U/L (ref 39–117)
BUN: 11 mg/dL (ref 6–23)
CO2: 29 meq/L (ref 19–32)
Calcium: 9.1 mg/dL (ref 8.4–10.5)
Chloride: 103 meq/L (ref 96–112)
Creatinine, Ser: 0.91 mg/dL (ref 0.40–1.50)
GFR: 88.4 mL/min
Glucose, Bld: 108 mg/dL — ABNORMAL HIGH (ref 70–99)
Potassium: 4.8 meq/L (ref 3.5–5.1)
Sodium: 137 meq/L (ref 135–145)
Total Bilirubin: 0.6 mg/dL (ref 0.2–1.2)
Total Protein: 6.7 g/dL (ref 6.0–8.3)

## 2016-05-15 LAB — HEMOGLOBIN A1C: Hgb A1c MFr Bld: 5.8 % (ref 4.6–6.5)

## 2016-05-15 LAB — TSH: TSH: 11.23 u[IU]/mL — ABNORMAL HIGH (ref 0.35–4.50)

## 2016-05-15 LAB — PSA, MEDICARE: PSA: 0.62 ng/mL (ref 0.10–4.00)

## 2016-05-15 LAB — T4, FREE: Free T4: 0.54 ng/dL — ABNORMAL LOW (ref 0.60–1.60)

## 2016-05-15 LAB — T3: T3, Total: 94.1 ng/dL (ref 76–181)

## 2016-05-15 NOTE — Progress Notes (Signed)
I reviewed health advisor's note, was available for consultation, and agree with documentation and plan.  

## 2016-05-15 NOTE — Progress Notes (Signed)
Subjective:   Henry Baldwin. is a 65 y.o. male who presents for Medicare Annual/Subsequent preventive examination.  Review of Systems:  N/A Cardiac Risk Factors include: advanced age (>43men, >35 women);male gender;dyslipidemia;hypertension     Objective:    Vitals: BP 122/78 mmHg  Pulse 58  Temp(Src) 97.5 F (36.4 C) (Oral)  Ht 5\' 8"  (1.727 m)  Wt 175 lb 4 oz (79.493 kg)  BMI 26.65 kg/m2  SpO2 98%  Body mass index is 26.65 kg/(m^2).  Tobacco History  Smoking status  . Former Smoker  . Quit date: 12/30/1998  Smokeless tobacco  . Never Used     Counseling given: No   Past Medical History  Diagnosis Date  . HTN (hypertension)   . Hypothyroid   . DJD (degenerative joint disease), lumbar   . HLD (hyperlipidemia)     Borderline  . Nephrolithiasis 02/2014  . Lyme disease 05/18/2013  . Cyst of right kidney 2015    2cm, stable on rpt [place]  . Carotid stenosis, asymptomatic 2016    1-39% B, rpt 2 yrs  . Cataract    Past Surgical History  Procedure Laterality Date  . Colonoscopy  10/28/01    with polypectomy  . Colonoscopy  10/19/2004    Divertics; no polyps  . Colonoscopy  11/20/10    Normal-Dr. Henrene Pastor  . Foreign body removal Left 2007    fifth finger  . Colonoscopy  01/2016    TA, mod diverticulosis, f/u 5 yrs Henrene Pastor)   Family History  Problem Relation Age of Onset  . Hypertension Father   . Thyroid disease Father   . Colon cancer Mother 12  . Congestive Heart Failure Mother   . Alcohol abuse Brother   . Thyroid disease Sister   . Cancer Paternal Aunt     prostate  . Thyroid disease Brother    History  Sexual Activity  . Sexual Activity: Yes    Outpatient Encounter Prescriptions as of 05/15/2016  Medication Sig  . aspirin 325 MG tablet Take 325 mg by mouth as needed.  . Naproxen Sodium (ALEVE PO) Take 1 tablet by mouth as needed.   No facility-administered encounter medications on file as of 05/15/2016.    Activities of Daily Living In your  present state of health, do you have any difficulty performing the following activities: 05/15/2016  Hearing? N  Vision? N  Difficulty concentrating or making decisions? N  Walking or climbing stairs? N  Dressing or bathing? N  Doing errands, shopping? N  Preparing Food and eating ? N  Using the Toilet? N  In the past six months, have you accidently leaked urine? N  Do you have problems with loss of bowel control? N  Managing your Medications? N  Managing your Finances? N  Housekeeping or managing your Housekeeping? N    Patient Care Team: Ria Bush, MD as PCP - General (Family Medicine) Macario Carls, MS, CCC-A as Referring Physician (Audiology) Lyla Glassing, MD as Referring Physician (Ophthalmology) Rosalene Billings, MD as Referring Physician (Dentistry)   Assessment:    Hearing Screening Comments: Wears bilateral hearing aids Vision Screening Comments: Last eye exam in Jan 2017  Exercise Activities and Dietary recommendations Current Exercise Habits: The patient has a physically strenous job, but has no regular exercise apart from work., Exercise limited by: None identified  Goals    . Increase physical activity     Starting 05/15/2016, I will continue to do yard work and chop wood for at  least 3-4 hrs for 6 days per week as weather permits.       Fall Risk Fall Risk  05/15/2016 03/28/2015  Falls in the past year? No Yes  Injury with Fall? - No   Depression Screen PHQ 2/9 Scores 05/15/2016 03/28/2015  PHQ - 2 Score 0 2    Cognitive Testing MMSE - Mini Mental State Exam 05/15/2016  Orientation to time 5  Orientation to Place 5  Registration 3  Attention/ Calculation 0  Recall 3  Language- name 2 objects 0  Language- repeat 1  Language- follow 3 step command 3  Language- read & follow direction 0  Write a sentence 0  Copy design 0  Total score 20   PLEASE NOTE: A Mini-Cog screen was completed. Maximum score is 20. A value of 0 denotes this part of  [place] MMSE was not completed or the patient failed this part of the Mini-Cog screening.   Mini-Cog Screening Orientation to Time - Max 5 pts Orientation to Place - Max 5 pts Registration - Max 3 pts Recall - Max 3 pts Language Repeat - Max 1 pts Language Follow 3 Step Command - Max 3 pts   Immunization History  Administered Date(s) Administered  . Influenza Whole 11/12/2005, 01/14/2013  . Influenza,inj,Quad PF,36+ Mos 10/05/2014  . Influenza-Unspecified 09/30/2013  . Pneumococcal Conjugate-13 03/28/2015  . Td 09/03/2001  . Tdap 03/31/2014   Screening Tests Health Maintenance  Topic Date Due  . PNA vac Low Risk Adult (2 of 2 - PPSV23) 05/22/2016 (Originally 03/27/2016)  . ZOSTAVAX  05/15/2017 (Originally 08/21/2009)  . INFLUENZA VACCINE  07/30/2016  . COLONOSCOPY  02/05/2021  . TETANUS/TDAP  03/31/2024  . DTaP/Tdap/Td  Completed  . Hepatitis C Screening  Completed      Plan:     I have personally reviewed and addressed the Medicare Annual Wellness questionnaire and have noted the following in the patient's chart:  A. Medical and social history B. Use of alcohol, tobacco or illicit drugs  C. Current medications and supplements D. Functional ability and status E.  Nutritional status F.  Physical activity G. Advance directives H. List of other physicians I.  Hospitalizations, surgeries, and ER visits in previous 12 months J.  [place] to include hearing, vision, cognitive, depression L. Referrals and appointments - none  In addition, I have reviewed and discussed with patient certain preventive protocols, quality metrics, and best practice recommendations. A written personalized care plan for preventive services as well as general preventive health recommendations were provided to patient.  See attached scanned questionnaire for additional information.   Signed,   Lindell Noe, MHA, BS, LPN Health Advisor [phone]

## 2016-05-15 NOTE — Patient Instructions (Signed)
Henry Baldwin , Thank you for taking time to come for your Medicare Wellness Visit. I appreciate your ongoing commitment to your health goals. Please review the following plan we discussed and let me know if I can assist you in the future.   These are the goals we discussed: Goals    . Increase physical activity     Starting 05/15/2016, I will continue to do yard work and chop wood for at least 3-4 hrs for 6 days per week as weather permits.        This is a list of the screening recommended for you and due dates:  Health Maintenance  Topic Date Due  . Pneumonia vaccines (2 of 2 - PPSV23) 05/22/2016*  . Shingles Vaccine  05/15/2017*  . Flu Shot  07/30/2016  . Colon Cancer Screening  02/05/2021  . Tetanus Vaccine  03/31/2024  . DTaP/Tdap/Td vaccine  Completed  .  Hepatitis C: One time screening is recommended by Center for Disease Control  (CDC) for  adults born from [birth year] through 1967.   Completed  *Topic was postponed. The date shown is not the original due date.    Preventive Care for Adults  A healthy lifestyle and preventive care can promote health and wellness. Preventive health guidelines for adults include the following key practices.  . A routine yearly physical is a good way to check with your health care provider about your health and preventive screening. It is a chance to share any concerns and updates on your health and to receive a thorough exam.  . Visit your dentist for a routine exam and preventive care every 6 months. Brush your teeth twice a day and floss once a day. Good oral hygiene prevents tooth decay and gum disease.  . The frequency of eye exams is based on your age, health, family medical history, use  of contact lenses, and other factors. Follow your health care provider's ecommendations for frequency of eye exams.  . Eat a healthy diet. Foods like vegetables, fruits, whole grains, low-fat dairy products, and lean protein foods contain the nutrients you need  without too many calories. Decrease your intake of foods high in solid fats, added sugars, and salt. Eat the right amount of calories for you. Get information about a proper diet from your health care provider, if necessary.  . Regular physical exercise is one of the most important things you can do for your health. Most adults should get at least 150 minutes of moderate-intensity exercise (any activity that increases your heart rate and causes you to sweat) each week. In addition, most adults need muscle-strengthening exercises on 2 or more days a week.  Silver Sneakers may be a benefit available to you. To determine eligibility, you may visit the website: www.silversneakers.com or contact program at (605)703-9160 Mon-Fri between 8AM-8PM.   . Maintain a healthy weight. The body mass index (BMI) is a screening tool to identify possible weight problems. It provides an estimate of body fat based on height and weight. Your health care provider can find your BMI and can help you achieve or maintain a healthy weight.   For adults 20 years and older: ? A BMI below 18.5 is considered underweight. ? A BMI of 18.5 to 24.9 is normal. ? A BMI of 25 to 29.9 is considered overweight. ? A BMI of 30 and above is considered obese.   . Maintain normal blood lipids and cholesterol levels by exercising and minimizing your intake of saturated fat.  Eat a balanced diet with plenty of fruit and vegetables. Blood tests for lipids and cholesterol should begin at age 72 and be repeated every 5 years. If your lipid or cholesterol levels are high, you are over 50, or you are at high risk for heart disease, you may need your cholesterol levels checked more frequently. Ongoing high lipid and cholesterol levels should be treated with medicines if diet and exercise are not working.  . If you smoke, find out from your health care provider how to quit. If you do not use tobacco, please do not start.  . If you choose to drink  alcohol, please do not consume more than 2 drinks per day. One drink is considered to be 12 ounces (355 mL) of beer, 5 ounces (148 mL) of wine, or 1.5 ounces (44 mL) of liquor.  . If you are 30-56 years old, ask your health care provider if you should take aspirin to prevent strokes.  . Use sunscreen. Apply sunscreen liberally and repeatedly throughout the day. You should seek shade when your shadow is shorter than you. Protect yourself by wearing long sleeves, pants, a wide-brimmed hat, and sunglasses year round, whenever you are outdoors.  . Once a month, do a whole body skin exam, using a mirror to look at the skin on your back. Tell your health care provider of new moles, moles that have irregular borders, moles that are larger than a pencil eraser, or moles that have changed in shape or color.

## 2016-05-15 NOTE — Progress Notes (Signed)
PCP notes:   Health maintenance: Pt requested to have PPSV23 during CPE on 05/22/16. Shingles vaccine was postponed at this time. Hep C screening was completed.   Abnormal screenings: None  Patient concerns: None

## 2016-05-15 NOTE — Progress Notes (Signed)
Pre visit review using our clinic review tool, if applicable. No additional management support is needed unless otherwise documented below in the visit note. 

## 2016-05-16 LAB — HEPATITIS C ANTIBODY: HCV Ab: NEGATIVE

## 2016-05-22 ENCOUNTER — Encounter: Payer: Self-pay | Admitting: Family Medicine

## 2016-05-22 ENCOUNTER — Ambulatory Visit (INDEPENDENT_AMBULATORY_CARE_PROVIDER_SITE_OTHER): Payer: PPO | Admitting: Family Medicine

## 2016-05-22 VITALS — BP 130/80 | HR 64 | Temp 97.5°F | Wt 178.5 lb

## 2016-05-22 DIAGNOSIS — Z7289 Other problems related to lifestyle: Secondary | ICD-10-CM

## 2016-05-22 DIAGNOSIS — F1090 Alcohol use, unspecified, uncomplicated: Secondary | ICD-10-CM

## 2016-05-22 DIAGNOSIS — Z23 Encounter for immunization: Secondary | ICD-10-CM | POA: Diagnosis not present

## 2016-05-22 DIAGNOSIS — E785 Hyperlipidemia, unspecified: Secondary | ICD-10-CM

## 2016-05-22 DIAGNOSIS — Z789 Other specified health status: Secondary | ICD-10-CM

## 2016-05-22 DIAGNOSIS — Z0001 Encounter for general adult medical examination with abnormal findings: Secondary | ICD-10-CM

## 2016-05-22 DIAGNOSIS — E039 Hypothyroidism, unspecified: Secondary | ICD-10-CM

## 2016-05-22 DIAGNOSIS — Z0000 Encounter for general adult medical examination without abnormal findings: Secondary | ICD-10-CM

## 2016-05-22 DIAGNOSIS — I6523 Occlusion and stenosis of bilateral carotid arteries: Secondary | ICD-10-CM

## 2016-05-22 DIAGNOSIS — R7303 Prediabetes: Secondary | ICD-10-CM | POA: Diagnosis not present

## 2016-05-22 MED ORDER — LEVOTHYROXINE SODIUM 50 MCG PO TABS: 50.0000 ug | ORAL_TABLET | Freq: Every day | ORAL | Status: DC

## 2016-05-22 NOTE — Progress Notes (Signed)
BP 130/80 mmHg  Pulse 64  Temp(Src) 97.5 F (36.4 C) (Oral)  Wt 178 lb 8 oz (80.967 kg)   CC: medicare wellness visit  Subjective:    Patient ID: Henry Loschiavo., male    DOB: 12-27-1951, 65 y.o.   MRN: [id number]  HPI: Henry Baldwin. is a 65 y.o. male presenting on 05/22/2016 for Annual Exam   Medicare wellness visit last week with Katha Cabal. Requested pneumovax.   Takes aspirin 325mg  mainly when he gets small hangover after drinking beer a few times a week - up to 8 beers (4d/wk)  Ongoing for years left hand paresthesias worse with using tools that vibrate. No neck pain or shoulder pain. No other paresthesias.   Hearing screen - uses hearing aides Vision screen - passed Fall risk screen - once fell upstairs without injury Depression screen - positive.  Preventative: COLONOSCOPY Date: 01/2016 TA, mod diverticulosis, f/u 5 yrs Henrene Pastor) Prostate cancer screening - discussed, [facility]x would like to screen. Lung cancer screening - quit >16 yrs ago Flu shot - yearly Tetanus shot - 03/2014 prevnar 2016, pneumovax today Shingles shot - discussed. Declines.  Advanced directive discussion - has at home. HCPOA is wife.  Seat belt use discussed Sunscreen use and skin screen discussed   Married, lives with wife, new cat  1 child  Occ: [employer], retired  Activity: outdoor work  Diet: good water, vegetables daily   Relevant past medical, surgical, family and social history reviewed and updated as indicated. Interim medical history since our last visit reviewed. Allergies and medications reviewed and updated. Current Outpatient Prescriptions on File Prior to Visit  Medication Sig  . aspirin 325 MG tablet Take 325 mg by mouth as needed.  . Naproxen Sodium (ALEVE PO) Take 1 tablet by mouth as needed.   No current facility-administered medications on file prior to visit.    Review of Systems  Constitutional: Negative for fever, chills, activity change, appetite  change, fatigue and unexpected weight change.  HENT: Negative for hearing loss.   Eyes: Negative for visual disturbance.  Respiratory: Negative for cough, chest tightness, shortness of breath and wheezing.   Cardiovascular: Negative for chest pain, palpitations and leg swelling.  Gastrointestinal: Negative for nausea, vomiting, abdominal pain, diarrhea, constipation, blood in stool and abdominal distention.  Genitourinary: Negative for hematuria and difficulty urinating.  Musculoskeletal: Negative for myalgias, arthralgias and neck pain.  Skin: Negative for rash.  Neurological: Negative for dizziness, seizures, syncope and headaches.  Hematological: Negative for adenopathy. Bruises/bleeds easily.  Psychiatric/Behavioral: Negative for dysphoric mood. The patient is not nervous/anxious.    Per HPI unless specifically indicated in ROS section     Objective:    BP 130/80 mmHg  Pulse 64  Temp(Src) 97.5 F (36.4 C) (Oral)  Wt 178 lb 8 oz (80.967 kg)  Wt Readings from Last 3 Encounters:  05/22/16 178 lb 8 oz (80.967 kg)  05/15/16 175 lb 4 oz (79.493 kg)  02/06/16 178 lb (80.74 kg)    Physical Exam  Constitutional: He is oriented to person, place, and time. He appears well-developed and well-nourished. No distress.  HENT:  Head: Normocephalic and atraumatic.  Right Ear: Hearing, tympanic membrane, external ear and ear canal normal.  Left Ear: Hearing, tympanic membrane, external ear and ear canal normal.  Nose: Nose normal.  Mouth/Throat: Uvula is midline, oropharynx is clear and moist and mucous membranes are normal. No oropharyngeal exudate, posterior oropharyngeal edema or posterior oropharyngeal erythema.  Eyes: Conjunctivae and  EOM are normal. Pupils are equal, round, and reactive to light. No scleral icterus.  Neck: Normal range of motion. Neck supple. Carotid bruit is present (L faint bruit). No thyromegaly present.  Cardiovascular: Normal rate, regular rhythm, normal heart  sounds and intact distal pulses.   No murmur heard. Pulses:      Radial pulses are 2+ on the right side, and 2+ on the left side.  Pulmonary/Chest: Effort normal and breath sounds normal. No respiratory distress. He has no wheezes. He has no rales.  Abdominal: Soft. Bowel sounds are normal. He exhibits no distension and no mass. There is no tenderness. There is no rebound and no guarding.  Genitourinary: Rectum normal and prostate normal. Rectal exam shows no external hemorrhoid, no internal hemorrhoid, no fissure, no mass, no tenderness and anal tone normal. Prostate is not enlarged (20gm) and not tender.  Musculoskeletal: Normal range of motion. He exhibits no edema.  Lymphadenopathy:    He has no cervical adenopathy.  Neurological: He is alert and oriented to person, place, and time.  CN grossly intact, station and gait intact  Skin: Skin is warm and dry. No rash noted.  Psychiatric: He has a normal mood and affect. His behavior is normal. Judgment and thought content normal.  Nursing note and vitals reviewed.  Results for orders placed or performed in visit on 05/15/16  Lipid panel  Result Value Ref Range   Cholesterol 267 (H) 0 - 200 mg/dL   Triglycerides 106.0 0.0 - 149.0 mg/dL   HDL 53.40 >39.00 mg/dL   VLDL 21.2 0.0 - 40.0 mg/dL   LDL Cholesterol 193 (H) 0 - 99 mg/dL   Total CHOL/HDL Ratio 5    NonHDL 214.00   Comprehensive metabolic panel  Result Value Ref Range   Sodium 137 135 - 145 mEq/L   Potassium 4.8 3.5 - 5.1 mEq/L   Chloride 103 96 - 112 mEq/L   CO2 29 19 - 32 mEq/L   Glucose, Bld 108 (H) 70 - 99 mg/dL   BUN 11 6 - 23 mg/dL   Creatinine, Ser 0.91 0.40 - 1.50 mg/dL   Total Bilirubin 0.6 0.2 - 1.2 mg/dL   Alkaline Phosphatase 58 39 - 117 U/L   AST 15 0 - 37 U/L   ALT 11 0 - 53 U/L   Total Protein 6.7 6.0 - 8.3 g/dL   Albumin 4.2 3.5 - 5.2 g/dL   Calcium 9.1 8.4 - 10.5 mg/dL   GFR 88.40 >60.00 mL/min  TSH  Result Value Ref Range   TSH 11.23 (H) 0.35 - 4.50  uIU/mL  Hemoglobin A1c  Result Value Ref Range   Hgb A1c MFr Bld 5.8 4.6 - 6.5 %  T3  Result Value Ref Range   T3, Total 94.1 76 - 181 ng/dL  T4, free  Result Value Ref Range   Free T4 0.54 (L) 0.60 - 1.60 ng/dL  PSA, Medicare  Result Value Ref Range   PSA 0.62 0.10 - 4.00 ng/ml  Hepatitis C antibody  Result Value Ref Range   HCV Ab NEGATIVE NEGATIVE      Assessment & Plan:   Problem List Items Addressed This Visit    Hypothyroidism    Has now developed frank hypothyroidism - start levothyroxine 63mcg daily, rtc 3 mo f/u labs. Discussed correct administration of levothyroxine.       Relevant Medications   levothyroxine (SYNTHROID, LEVOTHROID) 50 MCG tablet   Other Relevant Orders   Lipid panel   TSH  HLD (hyperlipidemia)    Chronic, deteriorated. Discussed healthy diet and lifestyle changes to affect improved numbers. Recheck levels in 3 mo - with better control of thyroid - and then decide on statin.       Relevant Orders   T4, free   Prediabetes    Discussed avoiding added sugars and sweetened beverages      Medicare annual wellness visit, initial - Primary    I have personally reviewed the Medicare Annual Wellness questionnaire and have noted 1. The patient's medical and social history 2. Their use of alcohol, tobacco or illicit drugs 3. Their current medications and supplements 4. The patient's functional ability including ADL's, fall risks, home safety risks and hearing or visual impairment. Cognitive function has been assessed and addressed as indicated.  5. Diet and physical activity 6. Evidence for depression or mood disorders The patients weight, height, BMI have been recorded in the chart. I have made referrals, counseling and provided education to the patient based on review of the above and I have provided the pt with a written personalized care plan for preventive services. Provider list updated.. See scanned questionairre as needed for further  documentation. Reviewed preventative protocols and updated unless pt declined.       Alcohol use ([place])    Again discussed alcohol use. rec decrease.      Carotid stenosis, asymptomatic    With left carotid bruit - persistent, carotid US stable 08/2015. Consider recheck next year.           Follow up plan: Return in about 1 year (around 05/22/2017), or as needed, for annual exam, prior fasting for blood work.  Ria Bush, MD

## 2016-05-22 NOTE — Assessment & Plan Note (Signed)
Chronic, deteriorated. Discussed healthy diet and lifestyle changes to affect improved numbers. Recheck levels in 3 mo - with better control of thyroid - and then decide on statin.

## 2016-05-22 NOTE — Progress Notes (Signed)
Pre visit review using our clinic review tool, if applicable. No additional management support is needed unless otherwise documented below in the visit note. 

## 2016-05-22 NOTE — Assessment & Plan Note (Signed)
Discussed avoiding added sugars and sweetened beverages 

## 2016-05-22 NOTE — Assessment & Plan Note (Signed)

## 2016-05-22 NOTE — Assessment & Plan Note (Signed)
Has now developed frank hypothyroidism - start levothyroxine 26mcg daily, rtc 3 mo f/u labs. Discussed correct administration of levothyroxine.

## 2016-05-22 NOTE — Addendum Note (Signed)
Addended by: Royann Shivers A on: 05/22/2016 11:08 AM   Modules accepted: Orders

## 2016-05-22 NOTE — Assessment & Plan Note (Signed)
With left carotid bruit - persistent, carotid US stable 08/2015. Consider recheck next year.

## 2016-05-22 NOTE — Patient Instructions (Addendum)
pnemovax today. Thyroid was too low - start levothyroxine 53mcg daily. Recheck thyroid 2 months after starting. (lab visit only).  Cholesterol is too high. Remember - more fruits and vegetables, more fish, less red meat and dairy products.  More soy, nuts, beans, barley, lentils, oats and plant sterol ester enriched margarine instead of butter. Bring [place] copy of your living will to update your chart. Return as needed or in year for next physical.  Health Maintenance, Male A healthy lifestyle and preventative care can promote health and wellness.  Maintain regular health, dental, and eye exams.  Eat a healthy diet. Foods like vegetables, fruits, whole grains, low-fat dairy products, and lean protein foods contain the nutrients you need and are low in calories. Decrease your intake of foods high in solid fats, added sugars, and salt. Get information about a proper diet from your health care provider, if necessary.  Regular physical exercise is one of the most important things you can do for your health. Most adults should get at least 150 minutes of moderate-intensity exercise (any activity that increases your heart rate and causes you to sweat) each week. In addition, most adults need muscle-strengthening exercises on 2 or more days a week.   Maintain a healthy weight. The body mass index (BMI) is a screening tool to identify possible weight problems. It provides an estimate of body fat based on height and weight. Your health care provider can find your BMI and can help you achieve or maintain a healthy weight. For males 20 years and older:  A BMI below 18.5 is considered underweight.  A BMI of 18.5 to 24.9 is normal.  A BMI of 25 to 29.9 is considered overweight.  A BMI of 30 and above is considered obese.  Maintain normal blood lipids and cholesterol by exercising and minimizing your intake of saturated fat. Eat a balanced diet with plenty of fruits and vegetables. Blood tests for lipids  and cholesterol should begin at age 45 and be repeated every 5 years. If your lipid or cholesterol levels are high, you are over age 8, or you are at high risk for heart disease, you may need your cholesterol levels checked more frequently.Ongoing high lipid and cholesterol levels should be treated with medicines if diet and exercise are not working.  If you smoke, find out from your health care provider how to quit. If you do not use tobacco, do not start.  Lung cancer screening is recommended for adults aged 41-80 years who are at high risk for developing lung cancer because of a history of smoking. A yearly low-dose CT scan of the lungs is recommended for people who have at least a 30-pack-year history of smoking and are current smokers or have quit within the past 15 years. A pack year of smoking is smoking an average of 1 pack of cigarettes a day for 1 year (for example, a 30-pack-year history of smoking could mean smoking 1 pack a day for 30 years or 2 packs a day for 15 years). Yearly screening should continue until the smoker has stopped smoking for at least 15 years. Yearly screening should be stopped for people who develop a health problem that would prevent them from having lung cancer treatment.  If you choose to drink alcohol, do not have more than 2 drinks per day. One drink is considered to be 12 oz (360 mL) of beer, 5 oz (150 mL) of wine, or 1.5 oz (45 mL) of liquor.  Avoid the  use of street drugs. Do not share needles with anyone. Ask for help if you need support or instructions about stopping the use of drugs.  High blood pressure causes heart disease and increases the risk of stroke. High blood pressure is more likely to develop in:  People who have blood pressure in the end of the normal range (100-139/85-89 mm Hg).  People who are overweight or obese.  People who are African American.  If you are 51-78 years of age, have your blood pressure checked every 3-5 years. If you are  18 years of age or older, have your blood pressure checked every year. You should have your blood pressure measured twice--once when you are at a hospital or clinic, and once when you are not at a hospital or clinic. Record the average of the two measurements. To check your blood pressure when you are not at a hospital or clinic, you can use:  An automated blood pressure machine at a pharmacy.  A home blood pressure monitor.  If you are 32-51 years old, ask your health care provider if you should take aspirin to prevent heart disease.  Diabetes screening involves taking a blood sample to check your fasting blood sugar level. This should be done once every 3 years after age 59 if you are at a normal weight and without risk factors for diabetes. Testing should be considered at a younger age or be carried out more frequently if you are overweight and have at least 1 risk factor for diabetes.  Colorectal cancer can be detected and often prevented. Most routine colorectal cancer screening begins at the age of 69 and continues through age 37. However, your health care provider may recommend screening at an earlier age if you have risk factors for colon cancer. On a yearly basis, your health care provider may provide home test kits to check for hidden blood in the stool. A small camera at the end of a tube may be used to directly examine the colon (sigmoidoscopy or colonoscopy) to detect the earliest forms of colorectal cancer. Talk to your health care provider about this at age 65 when routine screening begins. A direct exam of the colon should be repeated every 5-10 years through age 5, unless early forms of precancerous polyps or small growths are found.  People who are at an increased risk for hepatitis B should be screened for this virus. You are considered at high risk for hepatitis B if:  You were born in a country where hepatitis B occurs often. Talk with your health care provider about which  countries are considered high risk.  Your parents were born in a high-risk country and you have not received a shot to protect against hepatitis B (hepatitis B vaccine).  You have HIV or AIDS.  You use needles to inject street drugs.  You live with, or have sex with, someone who has hepatitis B.  You are a man who has sex with other men (MSM).  You get hemodialysis treatment.  You take certain medicines for conditions like cancer, organ transplantation, and autoimmune conditions.  Hepatitis C blood testing is recommended for all people born from [birth year] through 1967 and any individual with known risk factors for hepatitis C.  Healthy men should no longer receive prostate-specific antigen (PSA) blood tests as part of routine cancer screening. Talk to your health care provider about prostate cancer screening.  Testicular cancer screening is not recommended for adolescents or adult males who have no  symptoms. Screening includes self-exam, a health care provider exam, and other screening tests. Consult with your health care provider about any symptoms you have or any concerns you have about testicular cancer.  Practice safe sex. Use condoms and avoid high-risk sexual practices to reduce the spread of sexually transmitted infections (STIs).  You should be screened for STIs, including gonorrhea and chlamydia if:  You are sexually active and are younger than 24 years.  You are older than 24 years, and your health care provider tells you that you are at risk for this type of infection.  Your sexual activity has changed since you were last screened, and you are at an increased risk for chlamydia or gonorrhea. Ask your health care provider if you are at risk.  If you are at risk of being infected with HIV, it is recommended that you take a prescription medicine daily to prevent HIV infection. This is called pre-exposure prophylaxis (PrEP). You are considered at risk if:  You are a man who has  sex with other men (MSM).  You are a heterosexual man who is sexually active with multiple partners.  You take drugs by injection.  You are sexually active with a partner who has HIV.  Talk with your health care provider about whether you are at high risk of being infected with HIV. If you choose to begin PrEP, you should first be tested for HIV. You should then be tested every 3 months for as long as you are taking PrEP.  Use sunscreen. Apply sunscreen liberally and repeatedly throughout the day. You should seek shade when your shadow is shorter than you. Protect yourself by wearing long sleeves, pants, a wide-brimmed hat, and sunglasses year round whenever you are outdoors.  Tell your health care provider of new moles or changes in moles, especially if there is a change in shape or color. Also, tell your health care provider if a mole is larger than the size of a pencil eraser.  A one-time screening for abdominal aortic aneurysm (AAA) and surgical repair of large AAAs by ultrasound is recommended for men aged 25-75 years who are current or former smokers.  Stay current with your vaccines (immunizations).   This information is not intended to replace advice given to you by your health care provider. Make sure you discuss any questions you have with your health care provider.   Document Released: 06/13/2008 Document Revised: 01/06/2015 Document Reviewed: 05/13/2011 Elsevier Interactive Patient Education [employer].

## 2016-05-22 NOTE — Assessment & Plan Note (Deleted)
Persistent, carotid US stable 08/2015. Consider recheck next year.

## 2016-05-22 NOTE — Assessment & Plan Note (Signed)
Again discussed alcohol use. rec decrease.

## 2016-08-22 ENCOUNTER — Other Ambulatory Visit (INDEPENDENT_AMBULATORY_CARE_PROVIDER_SITE_OTHER): Payer: PPO

## 2016-08-22 DIAGNOSIS — E785 Hyperlipidemia, unspecified: Secondary | ICD-10-CM

## 2016-08-22 DIAGNOSIS — E039 Hypothyroidism, unspecified: Secondary | ICD-10-CM | POA: Diagnosis not present

## 2016-08-22 LAB — TSH: TSH: 5.7 u[IU]/mL — ABNORMAL HIGH (ref 0.35–4.50)

## 2016-08-22 LAB — LIPID PANEL
Cholesterol: 256 mg/dL — ABNORMAL HIGH (ref 0–200)
HDL: 58.6 mg/dL
LDL Cholesterol: 176 mg/dL — ABNORMAL HIGH (ref 0–99)
NonHDL: 197.51
Total CHOL/HDL Ratio: 4
Triglycerides: 109 mg/dL (ref 0.0–149.0)
VLDL: 21.8 mg/dL (ref 0.0–40.0)

## 2016-08-22 LAB — T4, FREE: Free T4: 0.86 ng/dL (ref 0.60–1.60)

## 2016-08-25 ENCOUNTER — Other Ambulatory Visit: Payer: Self-pay | Admitting: Family Medicine

## 2016-08-25 MED ORDER — LEVOTHYROXINE SODIUM 75 MCG PO TABS: 75.0000 ug | ORAL_TABLET | Freq: Every day | ORAL | 6 refills | Status: DC

## 2016-12-10 ENCOUNTER — Other Ambulatory Visit: Payer: Self-pay | Admitting: Family Medicine

## 2016-12-10 DIAGNOSIS — R202 Paresthesia of skin: Secondary | ICD-10-CM

## 2016-12-10 DIAGNOSIS — E039 Hypothyroidism, unspecified: Secondary | ICD-10-CM

## 2016-12-10 DIAGNOSIS — E785 Hyperlipidemia, unspecified: Secondary | ICD-10-CM

## 2016-12-10 DIAGNOSIS — R7303 Prediabetes: Secondary | ICD-10-CM

## 2016-12-12 ENCOUNTER — Other Ambulatory Visit (INDEPENDENT_AMBULATORY_CARE_PROVIDER_SITE_OTHER): Payer: PPO

## 2016-12-12 DIAGNOSIS — E785 Hyperlipidemia, unspecified: Secondary | ICD-10-CM

## 2016-12-12 DIAGNOSIS — R202 Paresthesia of skin: Secondary | ICD-10-CM

## 2016-12-12 DIAGNOSIS — R7303 Prediabetes: Secondary | ICD-10-CM | POA: Diagnosis not present

## 2016-12-12 DIAGNOSIS — E039 Hypothyroidism, unspecified: Secondary | ICD-10-CM

## 2016-12-12 LAB — BASIC METABOLIC PANEL WITH GFR
BUN: 13 mg/dL (ref 6–23)
CO2: 29 meq/L (ref 19–32)
Calcium: 9.1 mg/dL (ref 8.4–10.5)
Chloride: 102 meq/L (ref 96–112)
Creatinine, Ser: 0.94 mg/dL (ref 0.40–1.50)
GFR: 85 mL/min
Glucose, Bld: 104 mg/dL — ABNORMAL HIGH (ref 70–99)
Potassium: 4.1 meq/L (ref 3.5–5.1)
Sodium: 137 meq/L (ref 135–145)

## 2016-12-12 LAB — FOLATE: Folate: 21.1 ng/mL

## 2016-12-12 LAB — LIPID PANEL
Cholesterol: 248 mg/dL — ABNORMAL HIGH (ref 0–200)
HDL: 67.9 mg/dL
LDL Cholesterol: 169 mg/dL — ABNORMAL HIGH (ref 0–99)
NonHDL: 179.85
Total CHOL/HDL Ratio: 4
Triglycerides: 55 mg/dL (ref 0.0–149.0)
VLDL: 11 mg/dL (ref 0.0–40.0)

## 2016-12-12 LAB — TSH: TSH: 4.28 u[IU]/mL (ref 0.35–4.50)

## 2016-12-12 LAB — VITAMIN B12: Vitamin B-12: 166 pg/mL — ABNORMAL LOW (ref 211–911)

## 2016-12-12 LAB — HEMOGLOBIN A1C: Hgb A1c MFr Bld: 5.7 % (ref 4.6–6.5)

## 2016-12-13 LAB — CBC WITH DIFFERENTIAL/PLATELET
Basophils Absolute: 0 10*3/uL (ref 0.0–0.1)
Basophils Relative: 0.3 % (ref 0.0–3.0)
Eosinophils Absolute: 0.2 10*3/uL (ref 0.0–0.7)
Eosinophils Relative: 3.3 % (ref 0.0–5.0)
HCT: 42.6 % (ref 39.0–52.0)
Hemoglobin: 14.7 g/dL (ref 13.0–17.0)
Lymphocytes Relative: 29 % (ref 12.0–46.0)
Lymphs Abs: 1.5 10*3/uL (ref 0.7–4.0)
MCHC: 34.6 g/dL (ref 30.0–36.0)
MCV: 95.5 fl (ref 78.0–100.0)
Monocytes Absolute: 0.5 10*3/uL (ref 0.1–1.0)
Monocytes Relative: 9.9 % (ref 3.0–12.0)
Neutro Abs: 3 10*3/uL (ref 1.4–7.7)
Neutrophils Relative %: 57.5 % (ref 43.0–77.0)
Platelets: 260 10*3/uL (ref 150.0–400.0)
RBC: 4.46 Mil/uL (ref 4.22–5.81)
RDW: 13.4 % (ref 11.5–15.5)
WBC: 5.3 10*3/uL (ref 4.0–10.5)

## 2016-12-14 ENCOUNTER — Other Ambulatory Visit: Payer: Self-pay | Admitting: Family Medicine

## 2016-12-14 ENCOUNTER — Encounter: Payer: Self-pay | Admitting: Family Medicine

## 2016-12-14 DIAGNOSIS — E538 Deficiency of other specified B group vitamins: Secondary | ICD-10-CM | POA: Insufficient documentation

## 2016-12-14 MED ORDER — ATORVASTATIN CALCIUM 40 MG PO TABS: 40.0000 mg | ORAL_TABLET | Freq: Every day | ORAL | 6 refills | Status: DC

## 2016-12-14 NOTE — Progress Notes (Signed)
41yr ASCVD risk = 14.8%.

## 2016-12-25 ENCOUNTER — Ambulatory Visit (INDEPENDENT_AMBULATORY_CARE_PROVIDER_SITE_OTHER): Payer: PPO

## 2016-12-25 DIAGNOSIS — E538 Deficiency of other specified B group vitamins: Secondary | ICD-10-CM

## 2016-12-25 MED ORDER — CYANOCOBALAMIN 1000 MCG/ML IJ SOLN
1000.0000 ug | Freq: Once | INTRAMUSCULAR | Status: AC
  Administered 2016-12-25: 1000 ug via INTRAMUSCULAR

## 2017-01-29 ENCOUNTER — Ambulatory Visit (INDEPENDENT_AMBULATORY_CARE_PROVIDER_SITE_OTHER): Payer: PPO

## 2017-01-29 DIAGNOSIS — E538 Deficiency of other specified B group vitamins: Secondary | ICD-10-CM | POA: Diagnosis not present

## 2017-01-29 MED ORDER — CYANOCOBALAMIN 1000 MCG/ML IJ SOLN
1000.0000 ug | Freq: Once | INTRAMUSCULAR | Status: AC
  Administered 2017-01-29: 1000 ug via INTRAMUSCULAR

## 2017-01-31 ENCOUNTER — Ambulatory Visit (INDEPENDENT_AMBULATORY_CARE_PROVIDER_SITE_OTHER): Payer: PPO | Admitting: Family Medicine

## 2017-01-31 ENCOUNTER — Encounter: Payer: Self-pay | Admitting: Family Medicine

## 2017-01-31 VITALS — BP 132/78 | HR 60 | Temp 97.8°F | Resp 16 | Ht 68.0 in | Wt 178.0 lb

## 2017-01-31 DIAGNOSIS — Z7289 Other problems related to lifestyle: Secondary | ICD-10-CM

## 2017-01-31 DIAGNOSIS — E785 Hyperlipidemia, unspecified: Secondary | ICD-10-CM | POA: Diagnosis not present

## 2017-01-31 DIAGNOSIS — E538 Deficiency of other specified B group vitamins: Secondary | ICD-10-CM | POA: Diagnosis not present

## 2017-01-31 DIAGNOSIS — N529 Male erectile dysfunction, unspecified: Secondary | ICD-10-CM | POA: Diagnosis not present

## 2017-01-31 DIAGNOSIS — Z789 Other specified health status: Secondary | ICD-10-CM

## 2017-01-31 DIAGNOSIS — F1090 Alcohol use, unspecified, uncomplicated: Secondary | ICD-10-CM

## 2017-01-31 MED ORDER — SILDENAFIL CITRATE 100 MG PO TABS: 50.0000 mg | ORAL_TABLET | Freq: Every day | ORAL | 3 refills | Status: DC | PRN

## 2017-01-31 NOTE — Addendum Note (Signed)
Addended by: Ria Bush on: 01/31/2017 08:46 AM   Modules accepted: Orders

## 2017-01-31 NOTE — Assessment & Plan Note (Signed)
Discussed etiology - ?circulatory vs alcohol related - continue to encouraged decreased alcohol use.  Update EKG today. Discussed benefit/risks of viagra as well as common side effects and need to avoid nitrates. To seek ER care if priapism.  Coupon provided today. If too expensive, will Rx generic sildenafil

## 2017-01-31 NOTE — Patient Instructions (Addendum)
EKG today.  Trial viagra 50-100mg  as needed. Coupon provided today.  Return after May 24th labs and medicare wellness visit  Erectile Dysfunction Erectile dysfunction is the inability to get or sustain a good enough erection to have sexual intercourse. Erectile dysfunction may involve:  Inability to get an erection.  Lack of enough hardness to allow penetration.  Loss of the erection before sex is finished.  Premature ejaculation. CAUSES  Certain drugs, such as:  Pain relievers.  Antihistamines.  Antidepressants.  Blood pressure medicines.  Water pills (diuretics).  Ulcer medicines.  Muscle relaxants.  Illegal drugs.  Excessive drinking.  Psychological causes, such as:  Anxiety.  Depression.  Sadness.  Exhaustion.  Performance fear.  Stress.  Physical causes, such as:  Artery problems. This may include diabetes, smoking, liver disease, or atherosclerosis.  High blood pressure.  Hormonal problems, such as low testosterone.  Obesity.  Nerve problems. This may include back or pelvic injuries, diabetes mellitus, multiple sclerosis, or Parkinson disease. SYMPTOMS  Inability to get an erection.  Lack of enough hardness to allow penetration.  Loss of the erection before sex is finished.  Premature ejaculation.  Normal erections at some times, but with frequent unsatisfactory episodes.  Orgasms that are not satisfactory in sensation or frequency.  Low sexual satisfaction in either partner because of erection problems.  A curved penis occurring with erection. The curve may cause pain or may be too curved to allow for intercourse.  Never having nighttime erections. DIAGNOSIS Your caregiver can often diagnose this condition by:  Performing a physical exam to find other diseases or specific problems with the penis.  Asking you detailed questions about the problem.  Performing blood tests to check for diabetes mellitus or to measure hormone  levels.  Performing urine tests to find other underlying health conditions.  Performing an ultrasound exam to check for scarring.  Performing a test to check blood flow to the penis.  Doing a sleep study at home to measure nighttime erections. TREATMENT   You may be prescribed medicines by mouth.  You may be given medicine injections into the penis.  You may be prescribed a vacuum pump with a ring.  Penile implant surgery may be performed. You may receive:  An inflatable implant.  A semirigid implant.  Blood vessel surgery may be performed. HOME CARE INSTRUCTIONS  If you are prescribed oral medicine, you should take the medicine as prescribed. Do not increase the dosage without first discussing it with your physician.  If you are using self-injections, be careful to avoid any veins that are on the surface of the penis. Apply pressure to the injection site for 5 minutes.  If you are using a vacuum pump, make sure you have read the instructions before using it. Discuss any questions with your physician before taking the pump home. SEEK MEDICAL CARE IF:  You experience pain that is not responsive to the pain medicine you have been prescribed.  You experience nausea or vomiting. SEEK IMMEDIATE MEDICAL CARE IF:   When taking oral or injectable medications, you experience an erection that lasts longer than 4 hours. If your physician is unavailable, go to the nearest emergency room for evaluation. An erection that lasts much longer than 4 hours can result in permanent damage to your penis.  You have pain that is severe.  You develop redness, severe pain, or severe swelling of your penis.  You have redness spreading up into your groin or lower abdomen.  You are unable to  pass your urine. This information is not intended to replace advice given to you by your health care provider. Make sure you discuss any questions you have with your health care provider. Document Released:  12/13/2000 Document Revised: 08/18/2013 Document Reviewed: 05/20/2013 Elsevier Interactive Patient Education  2017 [employer].

## 2017-01-31 NOTE — Progress Notes (Signed)
BP 132/78   Pulse 60   Temp 97.8 F (36.6 C) (Oral)   Resp 16   Ht 5\' 8"  (1.727 m)   Wt 178 lb (80.7 kg)   SpO2 98%   BMI 27.06 kg/m    CC: ED Subjective:    Patient ID: Henry Cookey., male    DOB: Apr 14, 1951, 66 y.o.   MRN: [id number]  HPI: Henry Baldwin. is a 66 y.o. male presenting on 01/31/2017 for Erectile Dysfunction   Last 3 attempts at sex he has been unsuccessful. Trouble getting and maintaining erection. Erection doesn't last. Noticing trouble over the past year.   Alcohol - 4d a week has 6-9 beers/day.   Latest EKG 02/2015 stable. Denies chest pain, tightness, dyspnea with exertion. Stays very physically active. Mows 6 yards weekly, cuts and burns wood regularly.   Relevant past medical, surgical, family and social history reviewed and updated as indicated. Interim medical history since our last visit reviewed. Allergies and medications reviewed and updated. Current Outpatient Prescriptions on File Prior to Visit  Medication Sig  . aspirin 325 MG tablet Take 325 mg by mouth as needed.  Marland Kitchen atorvastatin (LIPITOR) 40 MG tablet Take 1 tablet (40 mg total) by mouth daily.  . cyanocobalamin (,VITAMIN B-12,) 1000 MCG/ML injection Inject 1 mL (1,000 mcg total) into the muscle every 30 (thirty) days.  Marland Kitchen levothyroxine (SYNTHROID, LEVOTHROID) 75 MCG tablet Take 1 tablet (75 mcg total) by mouth daily.   No current facility-administered medications on file prior to visit.     Review of Systems Per HPI unless specifically indicated in ROS section     Objective:    BP 132/78   Pulse 60   Temp 97.8 F (36.6 C) (Oral)   Resp 16   Ht 5\' 8"  (1.727 m)   Wt 178 lb (80.7 kg)   SpO2 98%   BMI 27.06 kg/m   Wt Readings from Last 3 Encounters:  01/31/17 178 lb (80.7 kg)  05/22/16 178 lb 8 oz (81 kg)  05/15/16 175 lb 4 oz (79.5 kg)    Physical Exam  Constitutional: He appears well-developed and well-nourished. No distress.  HENT:  Mouth/Throat: Oropharynx is clear  and moist. No oropharyngeal exudate.  Neck: No thyromegaly present.  Cardiovascular: Normal rate, regular rhythm, normal heart sounds and intact distal pulses.   No murmur heard. Pulmonary/Chest: Effort normal and breath sounds normal. No respiratory distress. He has no wheezes. He has no rales.  Abdominal: Hernia confirmed negative in the right inguinal area and confirmed negative in the left inguinal area.  Genitourinary: Testes normal and penis normal. Right testis shows no mass, no swelling and no tenderness. Right testis is descended. Left testis shows no mass, no swelling and no tenderness. Left testis is descended.  Musculoskeletal: He exhibits no edema.  Lymphadenopathy:       Right: No inguinal adenopathy present.       Left: No inguinal adenopathy present.  Nursing note and vitals reviewed.  Results for orders placed or performed in visit on 12/12/16  Lipid panel  Result Value Ref Range   Cholesterol 248 (H) 0 - 200 mg/dL   Triglycerides 55.0 0.0 - 149.0 mg/dL   HDL 67.90 >39.00 mg/dL   VLDL 11.0 0.0 - 40.0 mg/dL   LDL Cholesterol 169 (H) 0 - 99 mg/dL   Total CHOL/HDL Ratio 4    NonHDL 179.85   Hemoglobin A1c  Result Value Ref Range   Hgb A1c MFr Bld  5.7 4.6 - 6.5 %  TSH  Result Value Ref Range   TSH 4.28 0.35 - 4.50 uIU/mL  Basic metabolic panel  Result Value Ref Range   Sodium 137 135 - 145 mEq/L   Potassium 4.1 3.5 - 5.1 mEq/L   Chloride 102 96 - 112 mEq/L   CO2 29 19 - 32 mEq/L   Glucose, Bld 104 (H) 70 - 99 mg/dL   BUN 13 6 - 23 mg/dL   Creatinine, Ser 0.94 0.40 - 1.50 mg/dL   Calcium 9.1 8.4 - 10.5 mg/dL   GFR 85.00 >60.00 mL/min  Vitamin B12  Result Value Ref Range   Vitamin B-12 166 (L) 211 - 911 pg/mL  Folate  Result Value Ref Range   Folate 21.1 >5.9 ng/mL  CBC with Differential/Platelet  Result Value Ref Range   WBC 5.3 4.0 - 10.5 K/uL   RBC 4.46 4.22 - 5.81 Mil/uL   Hemoglobin 14.7 13.0 - 17.0 g/dL   HCT 42.6 39.0 - 52.0 %   MCV 95.5 78.0 -  100.0 fl   MCHC 34.6 30.0 - 36.0 g/dL   RDW 13.4 11.5 - 15.5 %   Platelets 260.0 150.0 - 400.0 K/uL   Neutrophils Relative % 57.5 43.0 - 77.0 %   Lymphocytes Relative 29.0 12.0 - 46.0 %   Monocytes Relative 9.9 3.0 - 12.0 %   Eosinophils Relative 3.3 0.0 - 5.0 %   Basophils Relative 0.3 0.0 - 3.0 %   Neutro Abs 3.0 1.4 - 7.7 K/uL   Lymphs Abs 1.5 0.7 - 4.0 K/uL   Monocytes Absolute 0.5 0.1 - 1.0 K/uL   Eosinophils Absolute 0.2 0.0 - 0.7 K/uL   Basophils Absolute 0.0 0.0 - 0.1 K/uL      Assessment & Plan:  Over 25 minutes were spent face-to-face with the patient during this encounter and >50% of that time was spent on counseling and coordination of care  Problem List Items Addressed This Visit    Alcohol use    Encouraged decreased use      Erectile dysfunction - Primary    Discussed etiology - ?circulatory vs alcohol related - continue to encouraged decreased alcohol use.  Update EKG today. Discussed benefit/risks of viagra as well as common side effects and need to avoid nitrates. To seek ER care if priapism.  Coupon provided today. If too expensive, will Rx generic sildenafil      HLD (hyperlipidemia)    Continues statin.       Vitamin B12 deficiency    Continues B12 shots           Follow up plan: Return in about 4 months (around 05/23/2017) for annual exam, prior fasting for blood work, medicare wellness visit.  Ria Bush, MD

## 2017-01-31 NOTE — Assessment & Plan Note (Signed)
Continues B12 shots

## 2017-01-31 NOTE — Assessment & Plan Note (Signed)
Continues statin 

## 2017-01-31 NOTE — Progress Notes (Signed)
Pre visit review using our clinic review tool, if applicable. No additional management support is needed unless otherwise documented below in the visit note. 

## 2017-01-31 NOTE — Assessment & Plan Note (Addendum)
Encouraged decreased use

## 2017-01-31 NOTE — Addendum Note (Signed)
Addended by: Valere Dross on: 01/31/2017 09:01 AM   Modules accepted: Orders

## 2017-02-02 ENCOUNTER — Encounter: Payer: Self-pay | Admitting: Family Medicine

## 2017-02-02 DIAGNOSIS — R9431 Abnormal electrocardiogram [ECG] [EKG]: Secondary | ICD-10-CM | POA: Insufficient documentation

## 2017-02-14 ENCOUNTER — Other Ambulatory Visit: Payer: Self-pay | Admitting: Family Medicine

## 2017-03-06 ENCOUNTER — Ambulatory Visit (INDEPENDENT_AMBULATORY_CARE_PROVIDER_SITE_OTHER): Payer: PPO

## 2017-03-06 ENCOUNTER — Telehealth: Payer: Self-pay

## 2017-03-06 DIAGNOSIS — R9431 Abnormal electrocardiogram [ECG] [EKG]: Secondary | ICD-10-CM

## 2017-03-06 DIAGNOSIS — E538 Deficiency of other specified B group vitamins: Secondary | ICD-10-CM

## 2017-03-06 MED ORDER — CYANOCOBALAMIN 1000 MCG/ML IJ SOLN
1000.0000 ug | Freq: Once | INTRAMUSCULAR | Status: AC
  Administered 2017-03-06: 1000 ug via INTRAMUSCULAR

## 2017-03-06 NOTE — Telephone Encounter (Signed)
Agree. plz notify EKG returned ok. Thank you.

## 2017-03-06 NOTE — Telephone Encounter (Signed)
Dr Danise Mina, Mr Orcutt came in for Vit B 12 and I repeated the EKG as requested; in your absence I asked Allie Bossier NP to look at EKG and she said appeared OK. Copy of todays EKG, 01/31/17 EKG and 03/28/15 EKG is in your in box for review. Thank you. I did tell pt that the NP had looked at EKG and thought it looked OK but would leave for you to review on your return. Pt said last time he got the EKG he was nervous and that may have caused artifact ?

## 2017-03-07 NOTE — Telephone Encounter (Signed)
Pt notified, he verbalized understanding.

## 2017-04-09 ENCOUNTER — Ambulatory Visit (INDEPENDENT_AMBULATORY_CARE_PROVIDER_SITE_OTHER): Payer: PPO

## 2017-04-09 DIAGNOSIS — E538 Deficiency of other specified B group vitamins: Secondary | ICD-10-CM

## 2017-04-09 MED ORDER — CYANOCOBALAMIN 1000 MCG/ML IJ SOLN
1000.0000 ug | Freq: Once | INTRAMUSCULAR | Status: AC
  Administered 2017-04-09: 1000 ug via INTRAMUSCULAR

## 2017-05-12 ENCOUNTER — Other Ambulatory Visit: Payer: Self-pay | Admitting: Family Medicine

## 2017-05-13 ENCOUNTER — Ambulatory Visit (INDEPENDENT_AMBULATORY_CARE_PROVIDER_SITE_OTHER): Payer: PPO

## 2017-05-13 DIAGNOSIS — E538 Deficiency of other specified B group vitamins: Secondary | ICD-10-CM | POA: Diagnosis not present

## 2017-05-13 MED ORDER — CYANOCOBALAMIN 1000 MCG/ML IJ SOLN
1000.0000 ug | Freq: Once | INTRAMUSCULAR | Status: AC
  Administered 2017-05-13: 1000 ug via INTRAMUSCULAR

## 2017-05-19 ENCOUNTER — Other Ambulatory Visit: Payer: Self-pay | Admitting: Family Medicine

## 2017-05-19 DIAGNOSIS — R7303 Prediabetes: Secondary | ICD-10-CM

## 2017-05-19 DIAGNOSIS — E785 Hyperlipidemia, unspecified: Secondary | ICD-10-CM

## 2017-05-19 DIAGNOSIS — E039 Hypothyroidism, unspecified: Secondary | ICD-10-CM

## 2017-05-19 DIAGNOSIS — E538 Deficiency of other specified B group vitamins: Secondary | ICD-10-CM

## 2017-05-19 DIAGNOSIS — Z125 Encounter for screening for malignant neoplasm of prostate: Secondary | ICD-10-CM

## 2017-05-19 DIAGNOSIS — I6523 Occlusion and stenosis of bilateral carotid arteries: Secondary | ICD-10-CM

## 2017-05-21 ENCOUNTER — Ambulatory Visit (INDEPENDENT_AMBULATORY_CARE_PROVIDER_SITE_OTHER): Payer: PPO

## 2017-05-21 VITALS — BP 118/84 | HR 64 | Temp 97.7°F | Ht 68.25 in | Wt 173.5 lb

## 2017-05-21 DIAGNOSIS — E039 Hypothyroidism, unspecified: Secondary | ICD-10-CM

## 2017-05-21 DIAGNOSIS — E538 Deficiency of other specified B group vitamins: Secondary | ICD-10-CM | POA: Diagnosis not present

## 2017-05-21 DIAGNOSIS — E785 Hyperlipidemia, unspecified: Secondary | ICD-10-CM

## 2017-05-21 DIAGNOSIS — R7303 Prediabetes: Secondary | ICD-10-CM

## 2017-05-21 DIAGNOSIS — Z0000 Encounter for general adult medical examination without abnormal findings: Secondary | ICD-10-CM | POA: Diagnosis not present

## 2017-05-21 DIAGNOSIS — Z125 Encounter for screening for malignant neoplasm of prostate: Secondary | ICD-10-CM

## 2017-05-21 LAB — LIPID PANEL
Cholesterol: 148 mg/dL (ref 0–200)
HDL: 56.8 mg/dL
LDL Cholesterol: 77 mg/dL (ref 0–99)
NonHDL: 91.15
Total CHOL/HDL Ratio: 3
Triglycerides: 69 mg/dL (ref 0.0–149.0)
VLDL: 13.8 mg/dL (ref 0.0–40.0)

## 2017-05-21 LAB — BASIC METABOLIC PANEL WITH GFR
BUN: 11 mg/dL (ref 6–23)
CO2: 26 meq/L (ref 19–32)
Calcium: 9.1 mg/dL (ref 8.4–10.5)
Chloride: 105 meq/L (ref 96–112)
Creatinine, Ser: 0.84 mg/dL (ref 0.40–1.50)
GFR: 96.65 mL/min
Glucose, Bld: 109 mg/dL — ABNORMAL HIGH (ref 70–99)
Potassium: 4.9 meq/L (ref 3.5–5.1)
Sodium: 138 meq/L (ref 135–145)

## 2017-05-21 LAB — PSA, MEDICARE: PSA: 0.71 ng/mL (ref 0.10–4.00)

## 2017-05-21 LAB — VITAMIN B12: Vitamin B-12: 555 pg/mL (ref 211–911)

## 2017-05-21 LAB — HEMOGLOBIN A1C: Hgb A1c MFr Bld: 5.9 % (ref 4.6–6.5)

## 2017-05-21 LAB — TSH: TSH: 2.76 u[IU]/mL (ref 0.35–4.50)

## 2017-05-21 NOTE — Progress Notes (Signed)
PCP notes:   Health maintenance:  No gaps identified.   Abnormal screenings:   None  Patient concerns:   None  Nurse concerns:  None  Next PCP appt:   05/27/17 @ 1030

## 2017-05-21 NOTE — Progress Notes (Signed)
Subjective:   Henry Baldwin. is a 66 y.o. male who presents for Medicare Annual/Subsequent preventive examination.  Review of Systems:  N/A Cardiac Risk Factors include: advanced age (>66men, >46 women);male gender;dyslipidemia;hypertension     Objective:    Vitals: BP 118/84 (BP Location: Left Arm, Patient Position: Sitting, Cuff Size: Normal)   Pulse 64   Temp 97.7 F (36.5 C) (Oral)   Ht 5' 8.25" (1.734 m) Comment: no shoes  Wt 173 lb 8 oz (78.7 kg)   SpO2 98%   BMI 26.19 kg/m   Body mass index is 26.19 kg/m.  Tobacco History  Smoking Status  . Former Smoker  . Quit date: 12/30/1998  Smokeless Tobacco  . Never Used     Counseling given: No   Past Medical History:  Diagnosis Date  . Carotid stenosis, asymptomatic 2016   1-39% B, rpt 2 yrs  . Cataract   . Cyst of right kidney 2015   2cm, stable on rpt [place]  . DJD (degenerative joint disease), lumbar   . HLD (hyperlipidemia)    Borderline  . HTN (hypertension)   . Hypothyroid   . Lyme disease 05/18/2013  . Nephrolithiasis 02/2014   Past Surgical History:  Procedure Laterality Date  . COLONOSCOPY  10/28/01   with polypectomy  . COLONOSCOPY  10/19/2004   Divertics; no polyps  . COLONOSCOPY  11/20/10   Normal-Dr. Henrene Pastor  . COLONOSCOPY  01/2016   TA, mod diverticulosis, f/u 5 yrs Henrene Pastor)  . FOREIGN BODY REMOVAL Left 2007   fifth finger   Family History  Problem Relation Age of Onset  . Colon cancer Mother 58  . Congestive Heart Failure Mother   . Hypertension Father   . Thyroid disease Father   . Alcohol abuse Brother   . Thyroid disease Sister   . Cancer Paternal Uncle        prostate  . Thyroid disease Brother    History  Sexual Activity  . Sexual activity: Yes    Outpatient Encounter Prescriptions as of 05/21/2017  Medication Sig  . aspirin 325 MG tablet Take 325 mg by mouth as needed.  Marland Kitchen atorvastatin (LIPITOR) 40 MG tablet TAKE 1 TABLET (40 MG TOTAL) BY MOUTH DAILY.  . cyanocobalamin  (,VITAMIN B-12,) 1000 MCG/ML injection Inject 1 mL (1,000 mcg total) into the muscle every 30 (thirty) days.  Marland Kitchen levothyroxine (SYNTHROID, LEVOTHROID) 75 MCG tablet TAKE 1 TABLET (75 MCG TOTAL) BY MOUTH DAILY.  . sildenafil (VIAGRA) 100 MG tablet Take 0.5-1 tablets (50-100 mg total) by mouth daily as needed for erectile dysfunction.   No facility-administered encounter medications on file as of 05/21/2017.     Activities of Daily Living In your present state of health, do you have any difficulty performing the following activities: 05/21/2017  Hearing? Y  Vision? N  Difficulty concentrating or making decisions? N  Walking or climbing stairs? N  Dressing or bathing? N  Doing errands, shopping? N  Preparing Food and eating ? N  Using the Toilet? N  In the past six months, have you accidently leaked urine? N  Do you have problems with loss of bowel control? N  Managing your Medications? N  Managing your Finances? N  Housekeeping or managing your Housekeeping? N  Some recent data might be hidden    Patient Care Team: Ria Bush, MD as PCP - General (Family Medicine) Macario Carls, MS, CCC-A as Referring Physician (Audiology) Lyla Glassing, MD as Referring Physician (Ophthalmology) Rosalene Billings.,  MD as Referring Physician (Dentistry)   Assessment:     Visual Acuity Screening   Right eye Left eye Both eyes  Without correction: 20/25-1 20/40 20/25-1  With correction:     Hearing Screening Comments: Bilateral hearing aids   Exercise Activities and Dietary recommendations Current Exercise Habits: The patient has a physically strenous job, but has no regular exercise apart from work., Exercise limited by: None identified  Goals    . Increase physical activity          Starting 05/21/2017, I will continue to do yard work and chop wood for at least 3-4 hrs for 6 days per week as weather permits.       Fall Risk Fall Risk  05/21/2017 05/15/2016 03/28/2015  Falls in the  past year? No No Yes  Injury with Fall? - - No   Depression Screen PHQ 2/9 Scores 05/21/2017 05/15/2016 03/28/2015  PHQ - 2 Score 0 0 2    Cognitive Function MMSE - Mini Mental State Exam 05/21/2017 05/15/2016  Orientation to time 5 5  Orientation to Place 5 5  Registration 3 3  Attention/ Calculation 0 0  Recall 3 3  Language- name 2 objects 0 0  Language- repeat 1 1  Language- follow 3 step command 3 3  Language- read & follow direction 0 0  Write a sentence 0 0  Copy design 0 0  Total score 20 20     PLEASE NOTE: A Mini-Cog screen was completed. Maximum score is 20. A value of 0 denotes this part of Folstein MMSE was not completed or the patient failed this part of the Mini-Cog screening.   Mini-Cog Screening Orientation to Time - Max 5 pts Orientation to Place - Max 5 pts Registration - Max 3 pts Recall - Max 3 pts Language Repeat - Max 1 pts Language Follow 3 Step Command - Max 3 pts     Immunization History  Administered Date(s) Administered  . Influenza Whole 11/12/2005, 01/14/2013  . Influenza,inj,Quad PF,36+ Mos 10/05/2014, 09/19/2016  . Influenza-Unspecified 09/30/2013  . Pneumococcal Conjugate-13 03/28/2015  . Pneumococcal Polysaccharide-23 05/22/2016  . Td 09/03/2001  . Tdap 03/31/2014   Screening Tests Health Maintenance  Topic Date Due  . INFLUENZA VACCINE  07/30/2017  . COLONOSCOPY  02/05/2021  . DTaP/Tdap/Td (2 - Td) 03/31/2024  . TETANUS/TDAP  03/31/2024  . Hepatitis C Screening  Completed  . PNA vac Low Risk Adult  Completed      Plan:    I have personally reviewed and addressed the Medicare Annual Wellness questionnaire and have noted the following in the patient's chart:  A. Medical and social history B. Use of alcohol, tobacco or illicit drugs  C. Current medications and supplements D. Functional ability and status E.  Nutritional status F.  Physical activity G. Advance directives H. List of other physicians I.  Hospitalizations,  surgeries, and ER visits in previous 12 months J.  [place] to include hearing, vision, cognitive, depression L. Referrals and appointments - none  In addition, I have reviewed and discussed with patient certain preventive protocols, quality metrics, and best practice recommendations. A written personalized care plan for preventive services as well as general preventive health recommendations were provided to patient.  See attached scanned questionnaire for additional information.   Signed,   Lindell Noe, MHA, BS, LPN Health Coach

## 2017-05-21 NOTE — Patient Instructions (Signed)
Henry Baldwin , Thank you for taking time to come for your Medicare Wellness Visit. I appreciate your ongoing commitment to your health goals. Please review the following plan we discussed and let me know if I can assist you in the future.   These are the goals we discussed: Goals    . Increase physical activity          Starting 05/21/2017, I will continue to do yard work and chop wood for at least 3-4 hrs for 6 days per week as weather permits.        This is a list of the screening recommended for you and due dates:  Health Maintenance  Topic Date Due  . Flu Shot  07/30/2017  . Colon Cancer Screening  02/05/2021  . DTaP/Tdap/Td vaccine (2 - Td) 03/31/2024  . Tetanus Vaccine  03/31/2024  .  Hepatitis C: One time screening is recommended by Center for Disease Control  (CDC) for  adults born from [birth year] through 1967.   Completed  . Pneumonia vaccines  Completed   Preventive Care for Adults  A healthy lifestyle and preventive care can promote health and wellness. Preventive health guidelines for adults include the following key practices.  . A routine yearly physical is a good way to check with your health care provider about your health and preventive screening. It is a chance to share any concerns and updates on your health and to receive a thorough exam.  . Visit your dentist for a routine exam and preventive care every 6 months. Brush your teeth twice a day and floss once a day. Good oral hygiene prevents tooth decay and gum disease.  . The frequency of eye exams is based on your age, health, family medical history, use  of contact lenses, and other factors. Follow your health care provider's ecommendations for frequency of eye exams.  . Eat a healthy diet. Foods like vegetables, fruits, whole grains, low-fat dairy products, and lean protein foods contain the nutrients you need without too many calories. Decrease your intake of foods high in solid fats, added sugars, and salt. Eat the  right amount of calories for you. Get information about a proper diet from your health care provider, if necessary.  . Regular physical exercise is one of the most important things you can do for your health. Most adults should get at least 150 minutes of moderate-intensity exercise (any activity that increases your heart rate and causes you to sweat) each week. In addition, most adults need muscle-strengthening exercises on 2 or more days a week.  Silver Sneakers may be a benefit available to you. To determine eligibility, you may visit the website: www.silversneakers.com or contact program at 7785225520 Mon-Fri between 8AM-8PM.   . Maintain a healthy weight. The body mass index (BMI) is a screening tool to identify possible weight problems. It provides an estimate of body fat based on height and weight. Your health care provider can find your BMI and can help you achieve or maintain a healthy weight.   For adults 20 years and older: ? A BMI below 18.5 is considered underweight. ? A BMI of 18.5 to 24.9 is normal. ? A BMI of 25 to 29.9 is considered overweight. ? A BMI of 30 and above is considered obese.   . Maintain normal blood lipids and cholesterol levels by exercising and minimizing your intake of saturated fat. Eat a balanced diet with plenty of fruit and vegetables. Blood tests for lipids and cholesterol should  begin at age 84 and be repeated every 5 years. If your lipid or cholesterol levels are high, you are over 50, or you are at high risk for heart disease, you may need your cholesterol levels checked more frequently. Ongoing high lipid and cholesterol levels should be treated with medicines if diet and exercise are not working.  . If you smoke, find out from your health care provider how to quit. If you do not use tobacco, please do not start.  . If you choose to drink alcohol, please do not consume more than 2 drinks per day. One drink is considered to be 12 ounces (355 mL) of  beer, 5 ounces (148 mL) of wine, or 1.5 ounces (44 mL) of liquor.  . If you are 48-75 years old, ask your health care provider if you should take aspirin to prevent strokes.  . Use sunscreen. Apply sunscreen liberally and repeatedly throughout the day. You should seek shade when your shadow is shorter than you. Protect yourself by wearing long sleeves, pants, a wide-brimmed hat, and sunglasses year round, whenever you are outdoors.  . Once a month, do a whole body skin exam, using a mirror to look at the skin on your back. Tell your health care provider of new moles, moles that have irregular borders, moles that are larger than a pencil eraser, or moles that have changed in shape or color.

## 2017-05-21 NOTE — Progress Notes (Signed)
Pre visit review using our clinic review tool, if applicable. No additional management support is needed unless otherwise documented below in the visit note. 

## 2017-05-21 NOTE — Progress Notes (Signed)
I reviewed health advisor's note, was available for consultation, and agree with documentation and plan.  

## 2017-05-27 ENCOUNTER — Encounter: Payer: Self-pay | Admitting: Family Medicine

## 2017-05-27 ENCOUNTER — Ambulatory Visit (INDEPENDENT_AMBULATORY_CARE_PROVIDER_SITE_OTHER): Payer: PPO | Admitting: Family Medicine

## 2017-05-27 VITALS — BP 152/68 | HR 62 | Temp 97.8°F | Ht 68.0 in | Wt 176.2 lb

## 2017-05-27 DIAGNOSIS — I6523 Occlusion and stenosis of bilateral carotid arteries: Secondary | ICD-10-CM | POA: Diagnosis not present

## 2017-05-27 DIAGNOSIS — Z789 Other specified health status: Secondary | ICD-10-CM | POA: Diagnosis not present

## 2017-05-27 DIAGNOSIS — G6289 Other specified polyneuropathies: Secondary | ICD-10-CM | POA: Diagnosis not present

## 2017-05-27 DIAGNOSIS — E039 Hypothyroidism, unspecified: Secondary | ICD-10-CM

## 2017-05-27 DIAGNOSIS — E785 Hyperlipidemia, unspecified: Secondary | ICD-10-CM | POA: Diagnosis not present

## 2017-05-27 DIAGNOSIS — Z7189 Other specified counseling: Secondary | ICD-10-CM | POA: Diagnosis not present

## 2017-05-27 DIAGNOSIS — E538 Deficiency of other specified B group vitamins: Secondary | ICD-10-CM | POA: Diagnosis not present

## 2017-05-27 DIAGNOSIS — N529 Male erectile dysfunction, unspecified: Secondary | ICD-10-CM | POA: Diagnosis not present

## 2017-05-27 DIAGNOSIS — Z0000 Encounter for general adult medical examination without abnormal findings: Secondary | ICD-10-CM | POA: Insufficient documentation

## 2017-05-27 DIAGNOSIS — R7303 Prediabetes: Secondary | ICD-10-CM | POA: Diagnosis not present

## 2017-05-27 DIAGNOSIS — F1090 Alcohol use, unspecified, uncomplicated: Secondary | ICD-10-CM

## 2017-05-27 DIAGNOSIS — Z7289 Other problems related to lifestyle: Secondary | ICD-10-CM

## 2017-05-27 DIAGNOSIS — G629 Polyneuropathy, unspecified: Secondary | ICD-10-CM | POA: Insufficient documentation

## 2017-05-27 MED ORDER — LEVOTHYROXINE SODIUM 75 MCG PO TABS: 75.0000 ug | ORAL_TABLET | Freq: Every day | ORAL | 1 refills | Status: DC

## 2017-05-27 NOTE — Assessment & Plan Note (Addendum)
Chronic, stable. Continue statin. Improved readings on statin. Tolerating med well. Declines starting aspirin 81mg  daily.  The 10-year ASCVD risk score Henry Baldwin Henry Baldwin., et al., 2013) is: 15.2%   Values used to calculate the score:     Age: 66 years     Sex: Male     Is Non-Hispanic African American: No     Diabetic: No     Tobacco smoker: No     Systolic Blood Pressure: 454 mmHg     Is BP treated: No     HDL Cholesterol: 56.8 mg/dL     Total Cholesterol: 148 mg/dL

## 2017-05-27 NOTE — Assessment & Plan Note (Signed)
We have repleted with IM replacement over the last 6 months. Will receive final shot next month then change to oral replacement 1056mcg daily.

## 2017-05-27 NOTE — Assessment & Plan Note (Signed)
Doing well with generic viagra.

## 2017-05-27 NOTE — Assessment & Plan Note (Signed)
Chronic, stable. Continue current regimen. 

## 2017-05-27 NOTE — Assessment & Plan Note (Signed)
Discussed avoiding added sugars and sweetened beverages. Encouraged decreased alcohol intake.

## 2017-05-27 NOTE — Assessment & Plan Note (Signed)
Advanced directive discussion - has at home. HCPOA is wife.

## 2017-05-27 NOTE — Progress Notes (Addendum)
BP (!) 152/68   Pulse 62   Temp 97.8 F (36.6 C) (Oral)   Ht 5\' 8"  (1.727 m)   Wt 176 lb 4 oz (79.9 kg)   SpO2 98%   BMI 26.80 kg/m    bp 156/88 on repeat  CC: CPE Subjective:    Patient ID: Henry Cookey., male    DOB: [birth year], 66 y.o.   MRN: [id number]  HPI: Henry Carico. is a 66 y.o. male presenting on 05/27/2017 for Annual Exam Part 2   ED - last visit we Rx viagra. Generic is helpful.  Ongoing alcohol use 6-8 beers/day. L>R hand paresthesias.  Vit B12 def - has been receiving b12 shots regularly over the past year.  Upcoming carotid US next month.   Saw Lesia last week for medicare wellness visit. Note reviewed.    Preventative: COLONOSCOPY Date: 01/2016 TA, mod diverticulosis, f/u 5 yrs Henrene Pastor) Prostate cancer screening - discussed, fmhx would like to screen.  Lung cancer screening - quit >16 yrs ago Flu shot - yearly Tdap - 03/2014 prevnar 2016, pneumovax 2017 Shingles shot - discussed. Declines.  Advanced directive discussion - has at home. HCPOA is wife.  Seat belt use discussed Sunscreen use and skin screen discussed  Ex smoker - quit 2000 Alcohol - heavy drinker on weekends (6-8)  Married, lives with wife, new cat  1 child  Occ: [employer], retired  Activity: outdoor work  Diet: good water, vegetables daily   Relevant past medical, surgical, family and social history reviewed and updated as indicated. Interim medical history since our last visit reviewed. Allergies and medications reviewed and updated. Outpatient Medications Prior to Visit  Medication Sig Dispense Refill  . aspirin 325 MG tablet Take 325 mg by mouth once a week.     Marland Kitchen atorvastatin (LIPITOR) 40 MG tablet TAKE 1 TABLET (40 MG TOTAL) BY MOUTH DAILY. 90 tablet 1  . cyanocobalamin (,VITAMIN B-12,) 1000 MCG/ML injection Inject 1 mL (1,000 mcg total) into the muscle every 30 (thirty) days. 1 mL 0  . sildenafil (VIAGRA) 100 MG tablet Take 0.5-1 tablets (50-100 mg  total) by mouth daily as needed for erectile dysfunction. 10 tablet 3  . levothyroxine (SYNTHROID, LEVOTHROID) 75 MCG tablet TAKE 1 TABLET (75 MCG TOTAL) BY MOUTH DAILY. 30 tablet 6   No facility-administered medications prior to visit.      Per HPI unless specifically indicated in ROS section below Review of Systems  Constitutional: Negative for activity change, appetite change, chills, fatigue, fever and unexpected weight change.  HENT: Negative for hearing loss.   Eyes: Negative for visual disturbance.  Respiratory: Negative for cough, chest tightness, shortness of breath and wheezing.   Cardiovascular: Negative for chest pain, palpitations and leg swelling.  Gastrointestinal: Negative for abdominal distention, abdominal pain, blood in stool, constipation, diarrhea, nausea and vomiting.  Genitourinary: Negative for difficulty urinating and hematuria.  Musculoskeletal: Negative for arthralgias, myalgias and neck pain.  Skin: Negative for rash.  Neurological: Negative for dizziness, seizures, syncope and headaches.  Hematological: Negative for adenopathy. Does not bruise/bleed easily.  Psychiatric/Behavioral: Negative for dysphoric mood. The patient is not nervous/anxious.        Objective:    BP (!) 152/68   Pulse 62   Temp 97.8 F (36.6 C) (Oral)   Ht 5\' 8"  (1.727 m)   Wt 176 lb 4 oz (79.9 kg)   SpO2 98%   BMI 26.80 kg/m   Wt Readings from Last 3 Encounters:  05/27/17 176 lb 4 oz (79.9 kg)  05/21/17 173 lb 8 oz (78.7 kg)  01/31/17 178 lb (80.7 kg)    Physical Exam  Constitutional: He is oriented to person, place, and time. He appears well-developed and well-nourished. No distress.  HENT:  Head: Normocephalic and atraumatic.  Right Ear: External ear normal. Decreased hearing is noted.  Left Ear: External ear normal. Decreased hearing is noted.  Nose: Nose normal.  Mouth/Throat: Uvula is midline, oropharynx is clear and moist and mucous membranes are normal. No  oropharyngeal exudate, posterior oropharyngeal edema or posterior oropharyngeal erythema.  Hearing aides in place  Eyes: Conjunctivae and EOM are normal. Pupils are equal, round, and reactive to light. No scleral icterus.  Neck: Normal range of motion. Neck supple. Carotid bruit is present (L sided). No thyromegaly present.  Cardiovascular: Normal rate, regular rhythm, normal heart sounds and intact distal pulses.   No murmur heard. Pulses:      Radial pulses are 2+ on the right side, and 2+ on the left side.  Pulmonary/Chest: Effort normal and breath sounds normal. No respiratory distress. He has no wheezes. He has no rales.  Abdominal: Soft. Bowel sounds are normal. He exhibits no distension and no mass. There is no tenderness. There is no rebound and no guarding.  Genitourinary: Rectum normal and prostate normal. Rectal exam shows no external hemorrhoid, no internal hemorrhoid, no fissure, no mass, no tenderness and anal tone normal. Prostate is not enlarged (20gm) and not tender.  Musculoskeletal: Normal range of motion. He exhibits no edema.  Lymphadenopathy:    He has no cervical adenopathy.  Neurological: He is alert and oriented to person, place, and time.  CN grossly intact, station and gait intact  Skin: Skin is warm and dry. No rash noted.  Psychiatric: He has a normal mood and affect. His behavior is normal. Judgment and thought content normal.  Nursing note and vitals reviewed.  Results for orders placed or performed in visit on 05/21/17  Lipid panel  Result Value Ref Range   Cholesterol 148 0 - 200 mg/dL   Triglycerides 69.0 0.0 - 149.0 mg/dL   HDL 56.80 >39.00 mg/dL   VLDL 13.8 0.0 - 40.0 mg/dL   LDL Cholesterol 77 0 - 99 mg/dL   Total CHOL/HDL Ratio 3    NonHDL 91.15   TSH  Result Value Ref Range   TSH 2.76 0.35 - 4.50 uIU/mL  PSA, Medicare  Result Value Ref Range   PSA 0.71 0.10 - 4.00 ng/ml  Basic metabolic panel  Result Value Ref Range   Sodium 138 135 - 145  mEq/L   Potassium 4.9 3.5 - 5.1 mEq/L   Chloride 105 96 - 112 mEq/L   CO2 26 19 - 32 mEq/L   Glucose, Bld 109 (H) 70 - 99 mg/dL   BUN 11 6 - 23 mg/dL   Creatinine, Ser 0.84 0.40 - 1.50 mg/dL   Calcium 9.1 8.4 - 10.5 mg/dL   GFR 96.65 >60.00 mL/min  Vitamin B12  Result Value Ref Range   Vitamin B-12 555 211 - 911 pg/mL  Hemoglobin A1c  Result Value Ref Range   Hgb A1c MFr Bld 5.9 4.6 - 6.5 %      Assessment & Plan:   Problem List Items Addressed This Visit    Advanced care planning/counseling discussion    Advanced directive discussion - has at home. HCPOA is wife.       Alcohol use    Continue to encourage  decreased use. Continue B12 replacement. Check folate next labs if not recently checked.      Carotid stenosis, asymptomatic    Persistent L carotid bruit - pending rpt [place] next week.       Erectile dysfunction    Doing well with generic viagra.       Health maintenance examination - Primary    Preventative protocols reviewed and updated unless pt declined. Discussed healthy diet and lifestyle.       HLD (hyperlipidemia)    Chronic, stable. Continue statin. Improved readings on statin. Tolerating med well. Declines starting aspirin 81mg  daily.  The 10-year ASCVD risk score Mikey Bussing DC Brooke Bonito., et al., 2013) is: 15.2%   Values used to calculate the score:     Age: 43 years     Sex: Male     Is Non-Hispanic African American: No     Diabetic: No     Tobacco smoker: No     Systolic Blood Pressure: 350 mmHg     Is BP treated: No     HDL Cholesterol: 56.8 mg/dL     Total Cholesterol: 148 mg/dL        Hypothyroidism    Chronic, stable. Continue current regimen.       Relevant Medications   levothyroxine (SYNTHROID, LEVOTHROID) 75 MCG tablet   Peripheral neuropathy    R>L hands. ?alcohol related. Some improvement with B12 replacement, but ongoing.       Prediabetes    Discussed avoiding added sugars and sweetened beverages. Encouraged decreased alcohol intake.         Vitamin B12 deficiency    We have repleted with IM replacement over the last 6 months. Will receive final shot next month then change to oral replacement 1019mcg daily.           Follow up plan: Return in about 1 year (around 05/27/2018) for annual exam, prior fasting for blood work, medicare wellness visit.  Ria Bush, MD

## 2017-05-27 NOTE — Assessment & Plan Note (Signed)
R>L hands. ?alcohol related. Some improvement with B12 replacement, but ongoing.

## 2017-05-27 NOTE — Assessment & Plan Note (Signed)
Preventative protocols reviewed and updated unless pt declined. Discussed healthy diet and lifestyle.  

## 2017-05-27 NOTE — Patient Instructions (Addendum)
You are doing well today. Last b12 shot next month, then start 1053mcg B12 oral over the counter daily.  Return as needed or in 1 year for medicare wellness with Katha Cabal and physical with me  Health Maintenance, Male A healthy lifestyle and preventive care is important for your health and wellness. Ask your health care provider about what schedule of regular examinations is right for you. What should I know about weight and diet?  Eat a Healthy Diet  Eat plenty of vegetables, fruits, whole grains, low-fat dairy products, and lean protein.  Do not eat a lot of foods high in solid fats, added sugars, or salt. Maintain a Healthy Weight  Regular exercise can help you achieve or maintain a healthy weight. You should:  Do at least 150 minutes of exercise each week. The exercise should increase your heart rate and make you sweat (moderate-intensity exercise).  Do strength-training exercises at least twice a week. Watch Your Levels of Cholesterol and Blood Lipids  Have your blood tested for lipids and cholesterol every 5 years starting at 68 years of age. If you are at high risk for heart disease, you should start having your blood tested when you are 68 years old. You may need to have your cholesterol levels checked more often if:  Your lipid or cholesterol levels are high.  You are older than 68 years of age.  You are at high risk for heart disease. What should I know about cancer screening? Many types of cancers can be detected early and may often be prevented. Lung Cancer  You should be screened every year for lung cancer if:  You are a current smoker who has smoked for at least 30 years.  You are a former smoker who has quit within the past 15 years.  Talk to your health care provider about your screening options, when you should start screening, and how often you should be screened. Colorectal Cancer  Routine colorectal cancer screening usually begins at 68 years of age and should  be repeated every 5-10 years until you are 68 years old. You may need to be screened more often if early forms of precancerous polyps or small growths are found. Your health care provider may recommend screening at an earlier age if you have risk factors for colon cancer.  Your health care provider may recommend using home test kits to check for hidden blood in the stool.  A small camera at the end of a tube can be used to examine your colon (sigmoidoscopy or colonoscopy). This checks for the earliest forms of colorectal cancer. Prostate and Testicular Cancer  Depending on your age and overall health, your health care provider may do certain tests to screen for prostate and testicular cancer.  Talk to your health care provider about any symptoms or concerns you have about testicular or prostate cancer. Skin Cancer  Check your skin from head to toe regularly.  Tell your health care provider about any new moles or changes in moles, especially if:  There is a change in a mole's size, shape, or color.  You have a mole that is larger than a pencil eraser.  Always use sunscreen. Apply sunscreen liberally and repeat throughout the day.  Protect yourself by wearing long sleeves, pants, a wide-brimmed hat, and sunglasses when outside. What should I know about heart disease, diabetes, and high blood pressure?  If you are 62-54 years of age, have your blood pressure checked every 3-5 years. If you are  60 years of age or older, have your blood pressure checked every year. You should have your blood pressure measured twice-once when you are at a hospital or clinic, and once when you are not at a hospital or clinic. Record the average of the two measurements. To check your blood pressure when you are not at a hospital or clinic, you can use:  An automated blood pressure machine at a pharmacy.  A home blood pressure monitor.  Talk to your health care provider about your target blood pressure.  If  you are between 35-61 years old, ask your health care provider if you should take aspirin to prevent heart disease.  Have regular diabetes screenings by checking your fasting blood sugar level.  If you are at a normal weight and have a low risk for diabetes, have this test once every three years after the age of 80.  If you are overweight and have a high risk for diabetes, consider being tested at a younger age or more often.  A one-time screening for abdominal aortic aneurysm (AAA) by ultrasound is recommended for men aged 1-75 years who are current or former smokers. What should I know about preventing infection? Hepatitis B  If you have a higher risk for hepatitis B, you should be screened for this virus. Talk with your health care provider to find out if you are at risk for hepatitis B infection. Hepatitis C  Blood testing is recommended for:  Everyone born from [birth year] through 1967.  Anyone with known risk factors for hepatitis C. Sexually Transmitted Diseases (STDs)  You should be screened each year for STDs including gonorrhea and chlamydia if:  You are sexually active and are younger than 68 years of age.  You are older than 68 years of age and your health care provider tells you that you are at risk for this type of infection.  Your sexual activity has changed since you were last screened and you are at an increased risk for chlamydia or gonorrhea. Ask your health care provider if you are at risk.  Talk with your health care provider about whether you are at high risk of being infected with HIV. Your health care provider may recommend a prescription medicine to help prevent HIV infection. What else can I do?  Schedule regular health, dental, and eye exams.  Stay current with your vaccines (immunizations).  Do not use any tobacco products, such as cigarettes, chewing tobacco, and e-cigarettes. If you need help quitting, ask your health care provider.  Limit alcohol intake  to no more than 2 drinks per day. One drink equals 12 ounces of beer, 5 ounces of wine, or 1 ounces of hard liquor.  Do not use street drugs.  Do not share needles.  Ask your health care provider for help if you need support or information about quitting drugs.  Tell your health care provider if you often feel depressed.  Tell your health care provider if you have ever been abused or do not feel safe at home. This information is not intended to replace advice given to you by your health care provider. Make sure you discuss any questions you have with your health care provider. Document Released: 06/13/2008 Document Revised: 08/14/2016 Document Reviewed: 09/19/2015 Elsevier Interactive Patient Education  2017 [employer].

## 2017-05-27 NOTE — Assessment & Plan Note (Signed)
Continue to encourage decreased use. Continue B12 replacement. Check folate next labs if not recently checked.

## 2017-05-27 NOTE — Assessment & Plan Note (Signed)
Persistent L carotid bruit - pending rpt [place] next week.

## 2017-06-02 ENCOUNTER — Ambulatory Visit: Payer: PPO

## 2017-06-02 DIAGNOSIS — I6523 Occlusion and stenosis of bilateral carotid arteries: Secondary | ICD-10-CM

## 2017-06-02 LAB — VAS US CAROTID
LEFT ECA DIAS: -15 cm/s
LEFT VERTEBRAL DIAS: -26 cm/s
Left CCA dist dias: -22 cm/s
Left CCA dist sys: -84 cm/s
Left CCA prox dias: 15 cm/s
Left CCA prox sys: 126 cm/s
Left ICA dist dias: -34 cm/s
Left ICA dist sys: -97 cm/s
Left ICA prox dias: -22 cm/s
Left ICA prox sys: -66 cm/s
RIGHT ECA DIAS: -18 cm/s
RIGHT VERTEBRAL DIAS: 20 cm/s
Right CCA prox dias: 19 cm/s
Right CCA prox sys: 108 cm/s
Right cca dist sys: -82 cm/s

## 2017-06-18 ENCOUNTER — Ambulatory Visit (INDEPENDENT_AMBULATORY_CARE_PROVIDER_SITE_OTHER): Payer: PPO | Admitting: *Deleted

## 2017-06-18 DIAGNOSIS — E538 Deficiency of other specified B group vitamins: Secondary | ICD-10-CM | POA: Diagnosis not present

## 2017-06-18 MED ORDER — CYANOCOBALAMIN 1000 MCG/ML IJ SOLN
1000.0000 ug | Freq: Once | INTRAMUSCULAR | Status: AC
  Administered 2017-06-18: 1000 ug via INTRAMUSCULAR

## 2017-08-07 ENCOUNTER — Other Ambulatory Visit: Payer: Self-pay | Admitting: Family Medicine

## 2017-12-10 ENCOUNTER — Other Ambulatory Visit: Payer: Self-pay | Admitting: Family Medicine

## 2018-03-11 ENCOUNTER — Other Ambulatory Visit: Payer: Self-pay | Admitting: Family Medicine

## 2018-05-26 ENCOUNTER — Ambulatory Visit: Payer: PPO

## 2018-06-01 ENCOUNTER — Encounter: Payer: PPO | Admitting: Family Medicine

## 2018-06-26 ENCOUNTER — Other Ambulatory Visit: Payer: Self-pay | Admitting: Family Medicine

## 2018-07-12 ENCOUNTER — Other Ambulatory Visit: Payer: Self-pay | Admitting: Family Medicine

## 2018-07-12 DIAGNOSIS — E039 Hypothyroidism, unspecified: Secondary | ICD-10-CM

## 2018-07-12 DIAGNOSIS — Z125 Encounter for screening for malignant neoplasm of prostate: Secondary | ICD-10-CM

## 2018-07-12 DIAGNOSIS — E538 Deficiency of other specified B group vitamins: Secondary | ICD-10-CM

## 2018-07-12 DIAGNOSIS — E785 Hyperlipidemia, unspecified: Secondary | ICD-10-CM

## 2018-07-12 DIAGNOSIS — R7303 Prediabetes: Secondary | ICD-10-CM

## 2018-07-13 ENCOUNTER — Ambulatory Visit (INDEPENDENT_AMBULATORY_CARE_PROVIDER_SITE_OTHER): Payer: PPO

## 2018-07-13 VITALS — BP 130/80 | HR 72 | Temp 97.6°F | Ht 68.0 in | Wt 171.5 lb

## 2018-07-13 DIAGNOSIS — E785 Hyperlipidemia, unspecified: Secondary | ICD-10-CM | POA: Diagnosis not present

## 2018-07-13 DIAGNOSIS — E538 Deficiency of other specified B group vitamins: Secondary | ICD-10-CM | POA: Diagnosis not present

## 2018-07-13 DIAGNOSIS — Z125 Encounter for screening for malignant neoplasm of prostate: Secondary | ICD-10-CM | POA: Diagnosis not present

## 2018-07-13 DIAGNOSIS — E039 Hypothyroidism, unspecified: Secondary | ICD-10-CM | POA: Diagnosis not present

## 2018-07-13 DIAGNOSIS — R7303 Prediabetes: Secondary | ICD-10-CM

## 2018-07-13 DIAGNOSIS — Z0000 Encounter for general adult medical examination without abnormal findings: Secondary | ICD-10-CM

## 2018-07-13 LAB — COMPREHENSIVE METABOLIC PANEL WITH GFR
ALT: 14 U/L (ref 0–53)
AST: 16 U/L (ref 0–37)
Albumin: 4.2 g/dL (ref 3.5–5.2)
Alkaline Phosphatase: 65 U/L (ref 39–117)
BUN: 13 mg/dL (ref 6–23)
CO2: 29 meq/L (ref 19–32)
Calcium: 9.1 mg/dL (ref 8.4–10.5)
Chloride: 102 meq/L (ref 96–112)
Creatinine, Ser: 0.99 mg/dL (ref 0.40–1.50)
GFR: 79.69 mL/min
Glucose, Bld: 113 mg/dL — ABNORMAL HIGH (ref 70–99)
Potassium: 5 meq/L (ref 3.5–5.1)
Sodium: 138 meq/L (ref 135–145)
Total Bilirubin: 0.7 mg/dL (ref 0.2–1.2)
Total Protein: 6.7 g/dL (ref 6.0–8.3)

## 2018-07-13 LAB — LIPID PANEL
Cholesterol: 151 mg/dL (ref 0–200)
HDL: 66.6 mg/dL
LDL Cholesterol: 75 mg/dL (ref 0–99)
NonHDL: 84.54
Total CHOL/HDL Ratio: 2
Triglycerides: 49 mg/dL (ref 0.0–149.0)
VLDL: 9.8 mg/dL (ref 0.0–40.0)

## 2018-07-13 LAB — VITAMIN B12: Vitamin B-12: >1500 pg/mL — ABNORMAL HIGH (ref 211–911)

## 2018-07-13 LAB — HEMOGLOBIN A1C: Hgb A1c MFr Bld: 5.9 % (ref 4.6–6.5)

## 2018-07-13 LAB — TSH: TSH: 2.36 u[IU]/mL (ref 0.35–4.50)

## 2018-07-13 LAB — PSA, MEDICARE: PSA: 0.75 ng/mL (ref 0.10–4.00)

## 2018-07-13 NOTE — Progress Notes (Signed)
PCP notes:   Health maintenance:  No gaps identified.  Abnormal screenings:   None  Patient concerns:   None  Nurse concerns:  None  Next PCP appt:   07/15/18 @ 1515

## 2018-07-13 NOTE — Progress Notes (Signed)
Subjective:   Duan Scharnhorst. is a 67 y.o. male who presents for Medicare Annual/Subsequent preventive examination.  Review of Systems:  N/A Cardiac Risk Factors include: advanced age (>83men, >61 women);male gender;hypertension;dyslipidemia     Objective:    Vitals: BP 130/80 (BP Location: Right Arm, Patient Position: Sitting, Cuff Size: Normal)   Pulse 72   Temp 97.6 F (36.4 C) (Oral)   Ht 5\' 8"  (1.727 m) Comment: no shoes  Wt 171 lb 8 oz (77.8 kg)   SpO2 99%   BMI 26.08 kg/m   Body mass index is 26.08 kg/m.  Advanced Directives 07/13/2018 05/21/2017 05/15/2016 02/06/2016 01/23/2016  Does Patient Have a Medical Advance Directive? No Yes Yes Yes Yes  Type of Advance Directive - Living will [place];Living will [place];Living will Living will;Healthcare Power of Attorney  Does patient want to make changes to medical advance directive? - - No - Patient declined - -  Copy of [place] in Chart? - - No - copy requested - -  Would patient like information on creating a medical advance directive? No - Patient declined - - - -    Tobacco Social History   Tobacco Use  Smoking Status Former Smoker  . Last attempt to quit: 12/30/1998  . Years since quitting: 19.5  Smokeless Tobacco Never Used     Counseling given: No   Clinical Intake:  Pre-visit preparation completed: Yes  Pain : No/denies pain Pain Score: 0-No pain     Nutritional Status: BMI 25 -29 Overweight Nutritional Risks: None Diabetes: No  How often do you need to have someone help you when you read instructions, pamphlets, or other written materials from your doctor or pharmacy?: 1 - Never What is the last grade level you completed in school?: Bachelors degree  Interpreter Needed?: No  Comments: pt lives with spouse Information entered by :: LPinson, LPN  Past Medical History:  Diagnosis Date  . Carotid stenosis, asymptomatic 2016   1-39% B,  rpt 2 yrs  . Cataract   . Cyst of right kidney 2015   2cm, stable on rpt [place]  . DJD (degenerative joint disease), lumbar   . HLD (hyperlipidemia)    Borderline  . HTN (hypertension)   . Hypothyroid   . Lyme disease 05/18/2013  . Nephrolithiasis 02/2014   Past Surgical History:  Procedure Laterality Date  . COLONOSCOPY  10/28/01   with polypectomy  . COLONOSCOPY  10/19/2004   Divertics; no polyps  . COLONOSCOPY  11/20/10   Normal-Dr. Henrene Pastor  . COLONOSCOPY  01/2016   TA, mod diverticulosis, f/u 5 yrs Henrene Pastor)  . FOREIGN BODY REMOVAL Left 2007   fifth finger   Family History  Problem Relation Age of Onset  . Colon cancer Mother 11  . Congestive Heart Failure Mother   . Hypertension Father   . Thyroid disease Father   . Alcohol abuse Brother   . Thyroid disease Sister   . Cancer Paternal Uncle        prostate  . Thyroid disease Brother    Social History   Socioeconomic History  . Marital status: Married    Spouse name: Not on file  . Number of children: 1  . Years of education: Not on file  . Highest education level: Not on file  Occupational History  . Occupation: Presenter, broadcasting: [employer]  Social Needs  . Financial resource strain: Not on file  .  Food insecurity:    Worry: Not on file    Inability: Not on file  . Transportation needs:    Medical: Not on file    Non-medical: Not on file  Tobacco Use  . Smoking status: Former Smoker    Last attempt to quit: 12/30/1998    Years since quitting: 19.5  . Smokeless tobacco: Never Used  Substance and Sexual Activity  . Alcohol use: Yes    Alcohol/week: 19.2 oz    Types: 32 Cans of beer per week  . Drug use: No  . Sexual activity: Yes  Lifestyle  . Physical activity:    Days per week: Not on file    Minutes per session: Not on file  . Stress: Not on file  Relationships  . Social connections:    Talks on phone: Not on file    Gets together: Not on file    Attends religious service: Not on file     Active member of club or organization: Not on file    Attends meetings of clubs or organizations: Not on file    Relationship status: Not on file  Other Topics Concern  . 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 aspirin 325 MG tablet Take 325 mg by mouth once a week.   Marland Kitchen atorvastatin (LIPITOR) 40 MG tablet TAKE 1 TABLET BY MOUTH EVERY DAY  . levothyroxine (SYNTHROID, LEVOTHROID) 75 MCG tablet TAKE 1 TABLET (75 MCG TOTAL) BY MOUTH DAILY.  . sildenafil (VIAGRA) 100 MG tablet TAKE 0.5-1 TABLETS (50-100 MG TOTAL) BY MOUTH DAILY AS NEEDED FOR ERECTILE DYSFUNCTION.  . vitamin B-12 (CYANOCOBALAMIN) 1000 MCG tablet Take 1,000 mcg by mouth daily.  . [DISCONTINUED] levothyroxine (SYNTHROID, LEVOTHROID) 75 MCG tablet Take 1 tablet (75 mcg total) by mouth daily.   No facility-administered encounter medications on file as of 07/13/2018.     Activities of Daily Living In your present state of health, do you have any difficulty performing the following activities: 07/13/2018  Hearing? Y  Vision? N  Difficulty concentrating or making decisions? N  Walking or climbing stairs? N  Dressing or bathing? N  Doing errands, shopping? N  Preparing Food and eating ? N  Using the Toilet? N  In the past six months, have you accidently leaked urine? N  Do you have problems with loss of bowel control? N  Managing your Medications? N  Managing your Finances? N  Housekeeping or managing your Housekeeping? N  Some recent data might be hidden    Patient Care Team: Ria Bush, MD as PCP - General (Family Medicine) Macario Carls, MS, CCC-A as Referring Physician (Audiology) Lyla Glassing, MD as Referring Physician (Ophthalmology) Rosalene Billings., MD as Referring Physician (Dentistry)     Assessment:   This is a routine wellness examination for [place].   Visual Acuity Screening   Right eye Left eye Both eyes  Without correction: 20/50 20/50-1 20/40-1  With correction:     Hearing Screening Comments: Hearing aids    Exercise Activities and Dietary recommendations Current Exercise Habits: The patient has a physically strenous job, but has no regular exercise apart from work.(lawn mowing, wood splitting, owns lots of acres), Exercise limited by: None identified  Goals    .  Increase physical activity     Starting 07/13/2018, I will continue to do yard work at least 4 hours daily.        Fall Risk Fall Risk  07/13/2018 05/21/2017 05/15/2016 03/28/2015  Falls in the past year? No No No Yes  Injury with Fall? - - - No   Depression Screen PHQ 2/9 Scores 07/13/2018 05/21/2017 05/15/2016 03/28/2015  PHQ - 2 Score 0 0 0 2  PHQ- 9 Score 0 - - -    Cognitive Function MMSE - Mini Mental State Exam 07/13/2018 05/21/2017 05/15/2016  Orientation to time 5 5 5   Orientation to Place 5 5 5   Registration 3 3 3   Attention/ Calculation 0 0 0  Recall 3 3 3   Language- name 2 objects 0 0 0  Language- repeat 1 1 1   Language- follow 3 step command 3 3 3   Language- read & follow direction 0 0 0  Write a sentence 0 0 0  Copy design 0 0 0  Total score 20 20 20      PLEASE NOTE: A Mini-Cog screen was completed. Maximum score is 20. A value of 0 denotes this part of [place] MMSE was not completed or the patient failed this part of the Mini-Cog screening.   Mini-Cog Screening Orientation to Time - Max 5 pts Orientation to Place - Max 5 pts Registration - Max 3 pts Recall - Max 3 pts Language Repeat - Max 1 pts Language Follow 3 Step Command - Max 3 pts     Immunization History  Administered Date(s) Administered  . Influenza Whole 11/12/2005, 01/14/2013  . Influenza, High Dose Seasonal PF 09/11/2017  . Influenza,inj,Quad PF,6+ Mos 10/05/2014, 09/19/2016  . Influenza-Unspecified  09/30/2013  . Pneumococcal Conjugate-13 03/28/2015  . Pneumococcal Polysaccharide-23 05/22/2016  . Td 09/03/2001  . Tdap 03/31/2014    Screening Tests Health Maintenance  Topic Date Due  . INFLUENZA VACCINE  07/30/2018  . COLONOSCOPY  02/05/2021  . DTaP/Tdap/Td (2 - Td) 03/31/2024  . TETANUS/TDAP  03/31/2024  . Hepatitis C Screening  Completed  . PNA vac Low Risk Adult  Completed       Plan:     I have personally reviewed, addressed, and noted the following in the patient's chart:  A. Medical and social history B. Use of alcohol, tobacco or illicit drugs  C. Current medications and supplements D. Functional ability and status E.  Nutritional status F.  Physical activity G. Advance directives H. List of other physicians I.  Hospitalizations, surgeries, and ER visits in previous 12 months J.  [place] to include hearing, vision, cognitive, depression L. Referrals and appointments - none  In addition, I have reviewed and discussed with patient certain preventive protocols, quality metrics, and best practice recommendations. A written personalized care plan for preventive services as well as general preventive health recommendations were provided to patient.  See attached scanned questionnaire for additional information.   Signed,   Lindell Noe, MHA, BS, LPN Health Coach

## 2018-07-13 NOTE — Patient Instructions (Signed)
Henry Baldwin , Thank you for taking time to come for your Medicare Wellness Visit. I appreciate your ongoing commitment to your health goals. Please review the following plan we discussed and let me know if I can assist you in the future.   These are the goals we discussed: Goals    . Increase physical activity     Starting 07/13/2018, I will continue to do yard work at least 4 hours daily.        This is a list of the screening recommended for you and due dates:  Health Maintenance  Topic Date Due  . Flu Shot  07/30/2018  . Colon Cancer Screening  02/05/2021  . DTaP/Tdap/Td vaccine (2 - Td) 03/31/2024  . Tetanus Vaccine  03/31/2024  .  Hepatitis C: One time screening is recommended by Center for Disease Control  (CDC) for  adults born from [birth year] through 1967.   Completed  . Pneumonia vaccines  Completed   Preventive Care for Adults  A healthy lifestyle and preventive care can promote health and wellness. Preventive health guidelines for adults include the following key practices.  . A routine yearly physical is a good way to check with your health care provider about your health and preventive screening. It is a chance to share any concerns and updates on your health and to receive a thorough exam.  . Visit your dentist for a routine exam and preventive care every 6 months. Brush your teeth twice a day and floss once a day. Good oral hygiene prevents tooth decay and gum disease.  . The frequency of eye exams is based on your age, health, family medical history, use  of contact lenses, and other factors. Follow your health care provider's recommendations for frequency of eye exams.  . Eat a healthy diet. Foods like vegetables, fruits, whole grains, low-fat dairy products, and lean protein foods contain the nutrients you need without too many calories. Decrease your intake of foods high in solid fats, added sugars, and salt. Eat the right amount of calories for you. Get information about  a proper diet from your health care provider, if necessary.  . Regular physical exercise is one of the most important things you can do for your health. Most adults should get at least 150 minutes of moderate-intensity exercise (any activity that increases your heart rate and causes you to sweat) each week. In addition, most adults need muscle-strengthening exercises on 2 or more days a week.  Silver Sneakers may be a benefit available to you. To determine eligibility, you may visit the website: www.silversneakers.com or contact program at 2894938464 Mon-Fri between 8AM-8PM.   . Maintain a healthy weight. The body mass index (BMI) is a screening tool to identify possible weight problems. It provides an estimate of body fat based on height and weight. Your health care provider can find your BMI and can help you achieve or maintain a healthy weight.   For adults 20 years and older: ? A BMI below 18.5 is considered underweight. ? A BMI of 18.5 to 24.9 is normal. ? A BMI of 25 to 29.9 is considered overweight. ? A BMI of 30 and above is considered obese.   . Maintain normal blood lipids and cholesterol levels by exercising and minimizing your intake of saturated fat. Eat a balanced diet with plenty of fruit and vegetables. Blood tests for lipids and cholesterol should begin at age 93 and be repeated every 5 years. If your lipid or cholesterol levels  are high, you are over 50, or you are at high risk for heart disease, you may need your cholesterol levels checked more frequently. Ongoing high lipid and cholesterol levels should be treated with medicines if diet and exercise are not working.  . If you smoke, find out from your health care provider how to quit. If you do not use tobacco, please do not start.  . If you choose to drink alcohol, please do not consume more than 2 drinks per day. One drink is considered to be 12 ounces (355 mL) of beer, 5 ounces (148 mL) of wine, or 1.5 ounces (44 mL) of  liquor.  . If you are 73-30 years old, ask your health care provider if you should take aspirin to prevent strokes.  . Use sunscreen. Apply sunscreen liberally and repeatedly throughout the day. You should seek shade when your shadow is shorter than you. Protect yourself by wearing long sleeves, pants, a wide-brimmed hat, and sunglasses year round, whenever you are outdoors.  . Once a month, do a whole body skin exam, using a mirror to look at the skin on your back. Tell your health care provider of new moles, moles that have irregular borders, moles that are larger than a pencil eraser, or moles that have changed in shape or color.

## 2018-07-15 ENCOUNTER — Ambulatory Visit (INDEPENDENT_AMBULATORY_CARE_PROVIDER_SITE_OTHER): Payer: PPO | Admitting: Family Medicine

## 2018-07-15 ENCOUNTER — Encounter: Payer: Self-pay | Admitting: Family Medicine

## 2018-07-15 VITALS — BP 120/76 | HR 63 | Temp 97.9°F | Ht 68.0 in | Wt 168.5 lb

## 2018-07-15 DIAGNOSIS — Z87891 Personal history of nicotine dependence: Secondary | ICD-10-CM

## 2018-07-15 DIAGNOSIS — R7303 Prediabetes: Secondary | ICD-10-CM | POA: Diagnosis not present

## 2018-07-15 DIAGNOSIS — E785 Hyperlipidemia, unspecified: Secondary | ICD-10-CM

## 2018-07-15 DIAGNOSIS — E538 Deficiency of other specified B group vitamins: Secondary | ICD-10-CM

## 2018-07-15 DIAGNOSIS — Z7289 Other problems related to lifestyle: Secondary | ICD-10-CM

## 2018-07-15 DIAGNOSIS — I6523 Occlusion and stenosis of bilateral carotid arteries: Secondary | ICD-10-CM | POA: Diagnosis not present

## 2018-07-15 DIAGNOSIS — Z0000 Encounter for general adult medical examination without abnormal findings: Secondary | ICD-10-CM | POA: Diagnosis not present

## 2018-07-15 DIAGNOSIS — F1090 Alcohol use, unspecified, uncomplicated: Secondary | ICD-10-CM

## 2018-07-15 DIAGNOSIS — G6289 Other specified polyneuropathies: Secondary | ICD-10-CM

## 2018-07-15 DIAGNOSIS — E039 Hypothyroidism, unspecified: Secondary | ICD-10-CM

## 2018-07-15 MED ORDER — CYANOCOBALAMIN 500 MCG PO TABS: 500.0000 ug | ORAL_TABLET | Freq: Every day | ORAL | Status: DC

## 2018-07-15 MED ORDER — LEVOTHYROXINE SODIUM 75 MCG PO TABS: 75.0000 ug | ORAL_TABLET | Freq: Every day | ORAL | 3 refills | Status: DC

## 2018-07-15 MED ORDER — ATORVASTATIN CALCIUM 40 MG PO TABS: 40.0000 mg | ORAL_TABLET | Freq: Every day | ORAL | 3 refills | Status: DC

## 2018-07-15 NOTE — Assessment & Plan Note (Signed)
Completed 6 months of IM shots.  Now taking 1028mcg daily oral replacement.  Levels elevated - rec decrease to 574mcg daily.

## 2018-07-15 NOTE — Assessment & Plan Note (Signed)
Persistent L carotid bruit, recent ultrasounds stable. Continue to monitor, consider rpt Q2 yrs vs monitoring clinically

## 2018-07-15 NOTE — Assessment & Plan Note (Signed)
Remains abstinent 

## 2018-07-15 NOTE — Patient Instructions (Addendum)
If interested, check with pharmacy about new 2 shot shingles series (shingrix).  You are doing well today Return as needed or in 1 year for next wellness visit We will watch left carotid artery as well as left hand numbness.   Health Maintenance, Male A healthy lifestyle and preventive care is important for your health and wellness. Ask your health care provider about what schedule of regular examinations is right for you. What should I know about weight and diet? Eat a Healthy Diet  Eat plenty of vegetables, fruits, whole grains, low-fat dairy products, and lean protein.  Do not eat a lot of foods high in solid fats, added sugars, or salt.  Maintain a Healthy Weight Regular exercise can help you achieve or maintain a healthy weight. You should:  Do at least 150 minutes of exercise each week. The exercise should increase your heart rate and make you sweat (moderate-intensity exercise).  Do strength-training exercises at least twice a week.  Watch Your Levels of Cholesterol and Blood Lipids  Have your blood tested for lipids and cholesterol every 5 years starting at 69 years of age. If you are at high risk for heart disease, you should start having your blood tested when you are 69 years old. You may need to have your cholesterol levels checked more often if: ? Your lipid or cholesterol levels are high. ? You are older than 69 years of age. ? You are at high risk for heart disease.  What should I know about cancer screening? Many types of cancers can be detected early and may often be prevented. Lung Cancer  You should be screened every year for lung cancer if: ? You are a current smoker who has smoked for at least 30 years. ? You are a former smoker who has quit within the past 15 years.  Talk to your health care provider about your screening options, when you should start screening, and how often you should be screened.  Colorectal Cancer  Routine colorectal cancer screening  usually begins at 69 years of age and should be repeated every 5-10 years until you are 69 years old. You may need to be screened more often if early forms of precancerous polyps or small growths are found. Your health care provider may recommend screening at an earlier age if you have risk factors for colon cancer.  Your health care provider may recommend using home test kits to check for hidden blood in the stool.  A small camera at the end of a tube can be used to examine your colon (sigmoidoscopy or colonoscopy). This checks for the earliest forms of colorectal cancer.  Prostate and Testicular Cancer  Depending on your age and overall health, your health care provider may do certain tests to screen for prostate and testicular cancer.  Talk to your health care provider about any symptoms or concerns you have about testicular or prostate cancer.  Skin Cancer  Check your skin from head to toe regularly.  Tell your health care provider about any new moles or changes in moles, especially if: ? There is a change in a mole's size, shape, or color. ? You have a mole that is larger than a pencil eraser.  Always use sunscreen. Apply sunscreen liberally and repeat throughout the day.  Protect yourself by wearing long sleeves, pants, a wide-brimmed hat, and sunglasses when outside.  What should I know about heart disease, diabetes, and high blood pressure?  If you are 53-26 years of age,  have your blood pressure checked every 3-5 years. If you are 87 years of age or older, have your blood pressure checked every year. You should have your blood pressure measured twice-once when you are at a hospital or clinic, and once when you are not at a hospital or clinic. Record the average of the two measurements. To check your blood pressure when you are not at a hospital or clinic, you can use: ? An automated blood pressure machine at a pharmacy. ? A home blood pressure monitor.  Talk to your health care  provider about your target blood pressure.  If you are between 75-17 years old, ask your health care provider if you should take aspirin to prevent heart disease.  Have regular diabetes screenings by checking your fasting blood sugar level. ? If you are at a normal weight and have a low risk for diabetes, have this test once every three years after the age of 63. ? If you are overweight and have a high risk for diabetes, consider being tested at a younger age or more often.  A one-time screening for abdominal aortic aneurysm (AAA) by ultrasound is recommended for men aged 7-75 years who are current or former smokers. What should I know about preventing infection? Hepatitis B If you have a higher risk for hepatitis B, you should be screened for this virus. Talk with your health care provider to find out if you are at risk for hepatitis B infection. Hepatitis C Blood testing is recommended for:  Everyone born from [birth year] through 1967.  Anyone with known risk factors for hepatitis C.  Sexually Transmitted Diseases (STDs)  You should be screened each year for STDs including gonorrhea and chlamydia if: ? You are sexually active and are younger than 69 years of age. ? You are older than 69 years of age and your health care provider tells you that you are at risk for this type of infection. ? Your sexual activity has changed since you were last screened and you are at an increased risk for chlamydia or gonorrhea. Ask your health care provider if you are at risk.  Talk with your health care provider about whether you are at high risk of being infected with HIV. Your health care provider may recommend a prescription medicine to help prevent HIV infection.  What else can I do?  Schedule regular health, dental, and eye exams.  Stay current with your vaccines (immunizations).  Do not use any tobacco products, such as cigarettes, chewing tobacco, and e-cigarettes. If you need help quitting, ask  your health care provider.  Limit alcohol intake to no more than 2 drinks per day. One drink equals 12 ounces of beer, 5 ounces of wine, or 1 ounces of hard liquor.  Do not use street drugs.  Do not share needles.  Ask your health care provider for help if you need support or information about quitting drugs.  Tell your health care provider if you often feel depressed.  Tell your health care provider if you have ever been abused or do not feel safe at home. This information is not intended to replace advice given to you by your health care provider. Make sure you discuss any questions you have with your health care provider. Document Released: 06/13/2008 Document Revised: 08/14/2016 Document Reviewed: 09/19/2015 Elsevier Interactive Patient Education  [employer].

## 2018-07-15 NOTE — Assessment & Plan Note (Signed)
L>R hand today. ?alcohol related vs CTS vs cervicogenic. Declines further eval with [facility] at this time. No better despite b12 replacement.

## 2018-07-15 NOTE — Assessment & Plan Note (Signed)
Preventative protocols reviewed and updated unless pt declined. Discussed healthy diet and lifestyle.  

## 2018-07-15 NOTE — Assessment & Plan Note (Signed)
Down to 6 beers on weekends including Thursday night. Discussed health risks of significant alcohol intake, encouraged continued taper.

## 2018-07-15 NOTE — Assessment & Plan Note (Signed)
Reviewed with patient and wife.

## 2018-07-15 NOTE — Progress Notes (Signed)
BP 120/76 (BP Location: Left Arm, Patient Position: Sitting, Cuff Size: Normal)   Pulse 63   Temp 97.9 F (36.6 C) (Oral)   Ht 5\' 8"  (1.727 m)   Wt 168 lb 8 oz (76.4 kg)   SpO2 97%   BMI 25.62 kg/m    CC: CPE Subjective:    Patient ID: Henry Baldwin., male    DOB: 1951-12-30, 67 y.o.   MRN: [id number]  HPI: Henry Baldwin. is a 67 y.o. male presenting on 07/15/2018 for Annual Exam (Pt 2.  Pt accompanied by wife, [name].)   Annia Belt Monday for medicare wellness visit. Note reviewed.  Wife to start chemo for breast cancer.  Habitual alcohol use - continues cutting down - 6 beers on weekends (starting Thursday) Vit B12 deficiency - last B12 shot was 05/2017.  Carotid US 05/2017 - mild plaque - monitoring clinically.   Persistent paresthesias of L hand - more noticeable with use of chain saw or weed eater (anything that vibrates) - does this every day. Not activity limiting.   Preventative: COLONOSCOPY Date: 01/2016 TA, mod diverticulosis, f/u 5 yrs Henrene Pastor) Prostate cancer screening - PSA reassuring - requests spacing out DRE Lung cancer screening - quit >16 yrs ago Flu shot - yearly Tdap - 03/2014 prevnar 2016, pneumovax 2017 Shingles shot - discussed. Declines.  Advanced directive discussion - has at home. HCPOA is wife.  Seat belt use discussed Sunscreen use and skin screen discussed  Ex smoker - quit 2000  Alcohol - heavy drinker on weekends (6-8) Dentist Q6 mo Eye exam yearly  Married, lives with wife, new cat  1 child  Occ: [employer], retired  Activity: outdoor work  Diet: good water, vegetables daily   Relevant past medical, surgical, family and social history reviewed and updated as indicated. Interim medical history since our last visit reviewed. Allergies and medications reviewed and updated. Outpatient Medications Prior to Visit  Medication Sig Dispense Refill  . aspirin 325 MG tablet Take 325 mg by mouth once a week.     .  sildenafil (VIAGRA) 100 MG tablet TAKE 0.5-1 TABLETS (50-100 MG TOTAL) BY MOUTH DAILY AS NEEDED FOR ERECTILE DYSFUNCTION. 10 tablet 3  . atorvastatin (LIPITOR) 40 MG tablet TAKE 1 TABLET BY MOUTH EVERY DAY 90 tablet 0  . levothyroxine (SYNTHROID, LEVOTHROID) 75 MCG tablet TAKE 1 TABLET (75 MCG TOTAL) BY MOUTH DAILY. 90 tablet 1  . vitamin B-12 (CYANOCOBALAMIN) 1000 MCG tablet Take 1,000 mcg by mouth daily.     No facility-administered medications prior to visit.      Per HPI unless specifically indicated in ROS section below Review of Systems  Constitutional: Negative for activity change, appetite change, chills, fatigue, fever and unexpected weight change.  HENT: Negative for hearing loss.   Eyes: Negative for visual disturbance.  Respiratory: Negative for cough, chest tightness, shortness of breath and wheezing.   Cardiovascular: Negative for chest pain, palpitations and leg swelling.  Gastrointestinal: Negative for abdominal distention, abdominal pain, blood in stool, constipation, diarrhea, nausea and vomiting.  Genitourinary: Negative for difficulty urinating and hematuria.  Musculoskeletal: Negative for arthralgias, myalgias and neck pain.  Skin: Negative for rash.  Neurological: Negative for dizziness, seizures, syncope and headaches.  Hematological: Negative for adenopathy. Does not bruise/bleed easily.  Psychiatric/Behavioral: Negative for dysphoric mood. The patient is not nervous/anxious.        Objective:    BP 120/76 (BP Location: Left Arm, Patient Position: Sitting, Cuff Size: Normal)  Pulse 63   Temp 97.9 F (36.6 C) (Oral)   Ht 5\' 8"  (1.727 m)   Wt 168 lb 8 oz (76.4 kg)   SpO2 97%   BMI 25.62 kg/m   Wt Readings from Last 3 Encounters:  07/15/18 168 lb 8 oz (76.4 kg)  07/13/18 171 lb 8 oz (77.8 kg)  05/27/17 176 lb 4 oz (79.9 kg)    Physical Exam  Constitutional: He is oriented to person, place, and time. He appears well-developed and well-nourished. No  distress.  HENT:  Head: Normocephalic and atraumatic.  Right Ear: Hearing, tympanic membrane, external ear and ear canal normal.  Left Ear: Hearing, tympanic membrane, external ear and ear canal normal.  Nose: Nose normal.  Mouth/Throat: Uvula is midline, oropharynx is clear and moist and mucous membranes are normal. No oropharyngeal exudate, posterior oropharyngeal edema or posterior oropharyngeal erythema.  Eyes: Pupils are equal, round, and reactive to light. Conjunctivae and EOM are normal. No scleral icterus.  Neck: Normal range of motion. Neck supple. Carotid bruit is not present. No thyromegaly present.  Cardiovascular: Normal rate, regular rhythm, normal heart sounds and intact distal pulses.  No murmur heard. Pulses:      Radial pulses are 2+ on the right side, and 2+ on the left side.  Pulmonary/Chest: Effort normal and breath sounds normal. No respiratory distress. He has no wheezes. He has no rales.  Abdominal: Soft. Bowel sounds are normal. He exhibits no distension and no mass. There is no tenderness. There is no rebound and no guarding.  Musculoskeletal: Normal range of motion. He exhibits no edema.  Lymphadenopathy:    He has no cervical adenopathy.  Neurological: He is alert and oriented to person, place, and time.  CN grossly intact, station and gait intact  Skin: Skin is warm and dry. No rash noted.  Psychiatric: He has a normal mood and affect. His behavior is normal. Judgment and thought content normal.  Nursing note and vitals reviewed.  Results for orders placed or performed in visit on 07/13/18  PSA, Medicare  Result Value Ref Range   PSA 0.75 0.10 - 4.00 ng/ml  Hemoglobin A1c  Result Value Ref Range   Hgb A1c MFr Bld 5.9 4.6 - 6.5 %  TSH  Result Value Ref Range   TSH 2.36 0.35 - 4.50 uIU/mL  Comprehensive metabolic panel  Result Value Ref Range   Sodium 138 135 - 145 mEq/L   Potassium 5.0 3.5 - 5.1 mEq/L   Chloride 102 96 - 112 mEq/L   CO2 29 19 - 32  mEq/L   Glucose, Bld 113 (H) 70 - 99 mg/dL   BUN 13 6 - 23 mg/dL   Creatinine, Ser 0.99 0.40 - 1.50 mg/dL   Total Bilirubin 0.7 0.2 - 1.2 mg/dL   Alkaline Phosphatase 65 39 - 117 U/L   AST 16 0 - 37 U/L   ALT 14 0 - 53 U/L   Total Protein 6.7 6.0 - 8.3 g/dL   Albumin 4.2 3.5 - 5.2 g/dL   Calcium 9.1 8.4 - 10.5 mg/dL   GFR 79.69 >60.00 mL/min  Lipid panel  Result Value Ref Range   Cholesterol 151 0 - 200 mg/dL   Triglycerides 49.0 0.0 - 149.0 mg/dL   HDL 66.60 >39.00 mg/dL   VLDL 9.8 0.0 - 40.0 mg/dL   LDL Cholesterol 75 0 - 99 mg/dL   Total CHOL/HDL Ratio 2    NonHDL 84.54   Vitamin B12  Result Value Ref  Range   Vitamin B-12 >1500 (H) 211 - 911 pg/mL      Assessment & Plan:   Problem List Items Addressed This Visit    Vitamin B12 deficiency    Completed 6 months of IM shots.  Now taking 1088mcg daily oral replacement.  Levels elevated - rec decrease to 542mcg daily.       Prediabetes    Reviewed with patient and wife.       Peripheral neuropathy    L>R hand today. ?alcohol related vs CTS vs cervicogenic. Declines further eval with [facility] at this time. No better despite b12 replacement.       Hypothyroidism    Chronic, stable. Continue current regimen.       Relevant Medications   levothyroxine (SYNTHROID, LEVOTHROID) 75 MCG tablet   HLD (hyperlipidemia)    Chronic, significant improvement with addition of lipitor. Continue current regimen. The 10-year ASCVD risk score Mikey Bussing DC Brooke Bonito., et al., 2013) is: 10.5%   Values used to calculate the score:     Age: 37 years     Sex: Male     Is Non-Hispanic African American: No     Diabetic: No     Tobacco smoker: No     Systolic Blood Pressure: 409 mmHg     Is BP treated: No     HDL Cholesterol: 66.6 mg/dL     Total Cholesterol: 151 mg/dL       Relevant Medications   atorvastatin (LIPITOR) 40 MG tablet   Health maintenance examination - Primary    Preventative protocols reviewed and updated unless pt  declined. Discussed healthy diet and lifestyle.       Habitual alcohol use    Down to 6 beers on weekends including Thursday night. Discussed health risks of significant alcohol intake, encouraged continued taper.      Ex-smoker    Remains abstinent.       Carotid stenosis, asymptomatic    Persistent L carotid bruit, recent ultrasounds stable. Continue to monitor, consider rpt Q2 yrs vs monitoring clinically      Relevant Medications   atorvastatin (LIPITOR) 40 MG tablet       Meds ordered this encounter  Medications  . atorvastatin (LIPITOR) 40 MG tablet    Sig: Take 1 tablet (40 mg total) by mouth daily.    Dispense:  90 tablet    Refill:  3  . levothyroxine (SYNTHROID, LEVOTHROID) 75 MCG tablet    Sig: Take 1 tablet (75 mcg total) by mouth daily.    Dispense:  90 tablet    Refill:  3  . vitamin B-12 (V-R VITAMIN B-12) 500 MCG tablet    Sig: Take 1 tablet (500 mcg total) by mouth daily.   No orders of the defined types were placed in this encounter.   Follow up plan: Return in about 1 year (around 07/16/2019) for annual exam, prior fasting for blood work, medicare wellness visit.  Ria Bush, MD

## 2018-07-15 NOTE — Assessment & Plan Note (Signed)
Chronic, stable. Continue current regimen. 

## 2018-07-15 NOTE — Assessment & Plan Note (Signed)
Chronic, significant improvement with addition of lipitor. Continue current regimen. The 10-year ASCVD risk score Mikey Bussing DC Brooke Bonito., et al., 2013) is: 10.5%   Values used to calculate the score:     Age: 67 years     Sex: Male     Is Non-Hispanic African American: No     Diabetic: No     Tobacco smoker: No     Systolic Blood Pressure: 117 mmHg     Is BP treated: No     HDL Cholesterol: 66.6 mg/dL     Total Cholesterol: 151 mg/dL

## 2018-07-31 NOTE — Progress Notes (Signed)
I reviewed health advisor's note, was available for consultation, and agree with documentation and plan.  

## 2019-04-26 ENCOUNTER — Other Ambulatory Visit: Payer: Self-pay | Admitting: Family Medicine

## 2019-06-22 ENCOUNTER — Other Ambulatory Visit: Payer: Self-pay | Admitting: Family Medicine

## 2019-07-14 ENCOUNTER — Other Ambulatory Visit: Payer: Self-pay | Admitting: Family Medicine

## 2019-07-14 DIAGNOSIS — E785 Hyperlipidemia, unspecified: Secondary | ICD-10-CM

## 2019-07-14 DIAGNOSIS — E039 Hypothyroidism, unspecified: Secondary | ICD-10-CM

## 2019-07-14 DIAGNOSIS — E538 Deficiency of other specified B group vitamins: Secondary | ICD-10-CM

## 2019-07-14 DIAGNOSIS — R7303 Prediabetes: Secondary | ICD-10-CM

## 2019-07-14 DIAGNOSIS — Z125 Encounter for screening for malignant neoplasm of prostate: Secondary | ICD-10-CM

## 2019-07-16 ENCOUNTER — Other Ambulatory Visit (INDEPENDENT_AMBULATORY_CARE_PROVIDER_SITE_OTHER): Payer: Medicare HMO

## 2019-07-16 DIAGNOSIS — Z125 Encounter for screening for malignant neoplasm of prostate: Secondary | ICD-10-CM

## 2019-07-16 DIAGNOSIS — R7303 Prediabetes: Secondary | ICD-10-CM | POA: Diagnosis not present

## 2019-07-16 DIAGNOSIS — E039 Hypothyroidism, unspecified: Secondary | ICD-10-CM | POA: Diagnosis not present

## 2019-07-16 DIAGNOSIS — E538 Deficiency of other specified B group vitamins: Secondary | ICD-10-CM | POA: Diagnosis not present

## 2019-07-16 DIAGNOSIS — E785 Hyperlipidemia, unspecified: Secondary | ICD-10-CM | POA: Diagnosis not present

## 2019-07-16 LAB — COMPREHENSIVE METABOLIC PANEL WITH GFR
ALT: 14 U/L (ref 0–53)
AST: 16 U/L (ref 0–37)
Albumin: 4.4 g/dL (ref 3.5–5.2)
Alkaline Phosphatase: 71 U/L (ref 39–117)
BUN: 13 mg/dL (ref 6–23)
CO2: 29 meq/L (ref 19–32)
Calcium: 9.2 mg/dL (ref 8.4–10.5)
Chloride: 101 meq/L (ref 96–112)
Creatinine, Ser: 0.9 mg/dL (ref 0.40–1.50)
GFR: 83.45 mL/min
Glucose, Bld: 100 mg/dL — ABNORMAL HIGH (ref 70–99)
Potassium: 4.8 meq/L (ref 3.5–5.1)
Sodium: 137 meq/L (ref 135–145)
Total Bilirubin: 0.8 mg/dL (ref 0.2–1.2)
Total Protein: 6.7 g/dL (ref 6.0–8.3)

## 2019-07-16 LAB — CBC WITH DIFFERENTIAL/PLATELET
Basophils Absolute: 0 10*3/uL (ref 0.0–0.1)
Basophils Relative: 0.6 % (ref 0.0–3.0)
Eosinophils Absolute: 0.1 10*3/uL (ref 0.0–0.7)
Eosinophils Relative: 2.1 % (ref 0.0–5.0)
HCT: 44.7 % (ref 39.0–52.0)
Hemoglobin: 14.9 g/dL (ref 13.0–17.0)
Lymphocytes Relative: 30.7 % (ref 12.0–46.0)
Lymphs Abs: 1.7 10*3/uL (ref 0.7–4.0)
MCHC: 33.3 g/dL (ref 30.0–36.0)
MCV: 98.4 fl (ref 78.0–100.0)
Monocytes Absolute: 0.5 10*3/uL (ref 0.1–1.0)
Monocytes Relative: 8.5 % (ref 3.0–12.0)
Neutro Abs: 3.2 10*3/uL (ref 1.4–7.7)
Neutrophils Relative %: 58.1 % (ref 43.0–77.0)
Platelets: 247 10*3/uL (ref 150.0–400.0)
RBC: 4.54 Mil/uL (ref 4.22–5.81)
RDW: 13.3 % (ref 11.5–15.5)
WBC: 5.5 10*3/uL (ref 4.0–10.5)

## 2019-07-16 LAB — LIPID PANEL
Cholesterol: 151 mg/dL (ref 0–200)
HDL: 61.7 mg/dL
LDL Cholesterol: 80 mg/dL (ref 0–99)
NonHDL: 89.12
Total CHOL/HDL Ratio: 2
Triglycerides: 44 mg/dL (ref 0.0–149.0)
VLDL: 8.8 mg/dL (ref 0.0–40.0)

## 2019-07-16 LAB — PSA: PSA: 0.7 ng/mL (ref 0.10–4.00)

## 2019-07-16 LAB — VITAMIN B12: Vitamin B-12: 821 pg/mL (ref 211–911)

## 2019-07-16 LAB — HEMOGLOBIN A1C: Hgb A1c MFr Bld: 5.9 % (ref 4.6–6.5)

## 2019-07-16 LAB — TSH: TSH: 2.72 u[IU]/mL (ref 0.35–4.50)

## 2019-07-16 LAB — T4, FREE: Free T4: 0.82 ng/dL (ref 0.60–1.60)

## 2019-07-19 ENCOUNTER — Other Ambulatory Visit: Payer: Self-pay

## 2019-07-19 ENCOUNTER — Ambulatory Visit (INDEPENDENT_AMBULATORY_CARE_PROVIDER_SITE_OTHER): Payer: Medicare HMO

## 2019-07-19 ENCOUNTER — Ambulatory Visit (INDEPENDENT_AMBULATORY_CARE_PROVIDER_SITE_OTHER): Payer: Medicare HMO | Admitting: Family Medicine

## 2019-07-19 ENCOUNTER — Encounter: Payer: Self-pay | Admitting: Family Medicine

## 2019-07-19 VITALS — BP 162/90 | HR 74 | Temp 98.6°F | Ht 69.0 in | Wt 171.8 lb

## 2019-07-19 VITALS — HR 72 | Temp 97.2°F | Ht 69.0 in | Wt 176.0 lb

## 2019-07-19 DIAGNOSIS — Z0000 Encounter for general adult medical examination without abnormal findings: Secondary | ICD-10-CM | POA: Diagnosis not present

## 2019-07-19 DIAGNOSIS — Z7189 Other specified counseling: Secondary | ICD-10-CM | POA: Diagnosis not present

## 2019-07-19 DIAGNOSIS — R7303 Prediabetes: Secondary | ICD-10-CM | POA: Diagnosis not present

## 2019-07-19 DIAGNOSIS — R030 Elevated blood-pressure reading, without diagnosis of hypertension: Secondary | ICD-10-CM | POA: Diagnosis not present

## 2019-07-19 DIAGNOSIS — Z7289 Other problems related to lifestyle: Secondary | ICD-10-CM | POA: Diagnosis not present

## 2019-07-19 DIAGNOSIS — M25562 Pain in left knee: Secondary | ICD-10-CM | POA: Insufficient documentation

## 2019-07-19 DIAGNOSIS — E785 Hyperlipidemia, unspecified: Secondary | ICD-10-CM | POA: Diagnosis not present

## 2019-07-19 DIAGNOSIS — F1090 Alcohol use, unspecified, uncomplicated: Secondary | ICD-10-CM

## 2019-07-19 DIAGNOSIS — E039 Hypothyroidism, unspecified: Secondary | ICD-10-CM | POA: Diagnosis not present

## 2019-07-19 DIAGNOSIS — E538 Deficiency of other specified B group vitamins: Secondary | ICD-10-CM | POA: Diagnosis not present

## 2019-07-19 DIAGNOSIS — I6523 Occlusion and stenosis of bilateral carotid arteries: Secondary | ICD-10-CM

## 2019-07-19 DIAGNOSIS — G6289 Other specified polyneuropathies: Secondary | ICD-10-CM | POA: Diagnosis not present

## 2019-07-19 DIAGNOSIS — I10 Essential (primary) hypertension: Secondary | ICD-10-CM | POA: Insufficient documentation

## 2019-07-19 NOTE — Progress Notes (Signed)
PCP notes:  Health Maintenance:  None  Abnormal Screenings:  None  Patient concerns:  Patient has pain in the left knee that comes and goes and a wart on the bottom of his right foot.  Nurse concerns:  None  Next PCP appt.: 07/19/2019 at 3:00

## 2019-07-19 NOTE — Assessment & Plan Note (Addendum)
Anticipate intermittent flares of acute osteoarthritis. Will monitor for now.

## 2019-07-19 NOTE — Assessment & Plan Note (Signed)
L carotid bruit persists however carotid US from 2018 with only mild disease with rec f/u PRN. Pt denies symptoms.

## 2019-07-19 NOTE — Assessment & Plan Note (Signed)
Preventative protocols reviewed and updated unless pt declined. Discussed healthy diet and lifestyle.  

## 2019-07-19 NOTE — Assessment & Plan Note (Signed)
Chronic, stable. Encouraged avoiding added sugars.

## 2019-07-19 NOTE — Assessment & Plan Note (Signed)
Levels repleted on daily replacement.

## 2019-07-19 NOTE — Patient Instructions (Signed)
Henry Baldwin , Thank you for taking time to come for your Medicare Wellness Visit. I appreciate your ongoing commitment to your health goals. Please review the following plan we discussed and let me know if I can assist you in the future.   Screening recommendations/referrals: Colonoscopy: 01/2016 Recommended yearly ophthalmology/optometry visit for glaucoma screening and checkup Recommended yearly dental visit for hygiene and checkup  Vaccinations: Influenza vaccine: 08/2018 Pneumococcal vaccine: 04/2016 Tdap vaccine: 03/2014 Shingles vaccine: discussed    Advanced directives: Please bring a copy of your POA (Power of [place]) and/or Living Will to your next appointment.    Conditions/risks identified: overweight  Next appointment: 07/19/2019 at 3:00  Preventive Care 5 Years and Older, Male Preventive care refers to lifestyle choices and visits with your health care provider that can promote health and wellness. What does preventive care include?  A yearly physical exam. This is also called an annual well check.  Dental exams once or twice a year.  Routine eye exams. Ask your health care provider how often you should have your eyes checked.  Personal lifestyle choices, including:  Daily care of your teeth and gums.  Regular physical activity.  Eating a healthy diet.  Avoiding tobacco and drug use.  Limiting alcohol use.  Practicing safe sex.  Taking low doses of aspirin every day.  Taking vitamin and mineral supplements as recommended by your health care provider. What happens during an annual well check? The services and screenings done by your health care provider during your annual well check will depend on your age, overall health, lifestyle risk factors, and family history of disease. Counseling  Your health care provider may ask you questions about your:  Alcohol use.  Tobacco use.  Drug use.  Emotional well-being.  Home and relationship well-being.   Sexual activity.  Eating habits.  History of falls.  Memory and ability to understand (cognition).  Work and work Statistician. Screening  You may have the following tests or measurements:  Height, weight, and BMI.  Blood pressure.  Lipid and cholesterol levels. These may be checked every 5 years, or more frequently if you are over 39 years old.  Skin check.  Lung cancer screening. You may have this screening every year starting at age 13 if you have a 30-pack-year history of smoking and currently smoke or have quit within the past 15 years.  Fecal occult blood test (FOBT) of the stool. You may have this test every year starting at age 29.  Flexible sigmoidoscopy or colonoscopy. You may have a sigmoidoscopy every 5 years or a colonoscopy every 10 years starting at age 19.  Prostate cancer screening. Recommendations will vary depending on your family history and other risks.  Hepatitis C blood test.  Hepatitis B blood test.  Sexually transmitted disease (STD) testing.  Diabetes screening. This is done by checking your blood sugar (glucose) after you have not eaten for a while (fasting). You may have this done every 1-3 years.  Abdominal aortic aneurysm (AAA) screening. You may need this if you are a current or former smoker.  Osteoporosis. You may be screened starting at age 65 if you are at high risk. Talk with your health care provider about your test results, treatment options, and if necessary, the need for more tests. Vaccines  Your health care provider may recommend certain vaccines, such as:  Influenza vaccine. This is recommended every year.  Tetanus, diphtheria, and acellular pertussis (Tdap, Td) vaccine. You may need a Td booster every  10 years.  Zoster vaccine. You may need this after age 5.  Pneumococcal 13-valent conjugate (PCV13) vaccine. One dose is recommended after age 12.  Pneumococcal polysaccharide (PPSV23) vaccine. One dose is recommended after  age 31. Talk to your health care provider about which screenings and vaccines you need and how often you need them. This information is not intended to replace advice given to you by your health care provider. Make sure you discuss any questions you have with your health care provider. Document Released: 01/12/2016 Document Revised: 09/04/2016 Document Reviewed: 10/17/2015 Elsevier Interactive Patient Education  2017 [employer] Prevention in the Home Falls can cause injuries. They can happen to people of all ages. There are many things you can do to make your home safe and to help prevent falls. What can I do on the outside of my home?  Regularly fix the edges of walkways and driveways and fix any cracks.  Remove anything that might make you trip as you walk through a door, such as a raised step or threshold.  Trim any bushes or trees on the path to your home.  Use bright outdoor lighting.  Clear any walking paths of anything that might make someone trip, such as rocks or tools.  Regularly check to see if handrails are loose or broken. Make sure that both sides of any steps have handrails.  Any raised decks and porches should have guardrails on the edges.  Have any leaves, snow, or ice cleared regularly.  Use sand or salt on walking paths during winter.  Clean up any spills in your garage right away. This includes oil or grease spills. What can I do in the bathroom?  Use night lights.  Install grab bars by the toilet and in the tub and shower. Do not use towel bars as grab bars.  Use non-skid mats or decals in the tub or shower.  If you need to sit down in the shower, use a plastic, non-slip stool.  Keep the floor dry. Clean up any water that spills on the floor as soon as it happens.  Remove soap buildup in the tub or shower regularly.  Attach bath mats securely with double-sided non-slip rug tape.  Do not have throw rugs and other things on the floor that can  make you trip. What can I do in the bedroom?  Use night lights.  Make sure that you have a light by your bed that is easy to reach.  Do not use any sheets or blankets that are too big for your bed. They should not hang down onto the floor.  Have a firm chair that has side arms. You can use this for support while you get dressed.  Do not have throw rugs and other things on the floor that can make you trip. What can I do in the kitchen?  Clean up any spills right away.  Avoid walking on wet floors.  Keep items that you use a lot in easy-to-reach places.  If you need to reach something above you, use a strong step stool that has a grab bar.  Keep electrical cords out of the way.  Do not use floor polish or wax that makes floors slippery. If you must use wax, use non-skid floor wax.  Do not have throw rugs and other things on the floor that can make you trip. What can I do with my stairs?  Do not leave any items on the stairs.  Make sure  that there are handrails on both sides of the stairs and use them. Fix handrails that are broken or loose. Make sure that handrails are as long as the stairways.  Check any carpeting to make sure that it is firmly attached to the stairs. Fix any carpet that is loose or worn.  Avoid having throw rugs at the top or bottom of the stairs. If you do have throw rugs, attach them to the floor with carpet tape.  Make sure that you have a light switch at the top of the stairs and the bottom of the stairs. If you do not have them, ask someone to add them for you. What else can I do to help prevent falls?  Wear shoes that:  Do not have high heels.  Have rubber bottoms.  Are comfortable and fit you well.  Are closed at the toe. Do not wear sandals.  If you use a stepladder:  Make sure that it is fully opened. Do not climb a closed stepladder.  Make sure that both sides of the stepladder are locked into place.  Ask someone to hold it for you,  if possible.  Clearly mark and make sure that you can see:  Any grab bars or handrails.  First and last steps.  Where the edge of each step is.  Use tools that help you move around (mobility aids) if they are needed. These include:  Canes.  Walkers.  Scooters.  Crutches.  Turn on the lights when you go into a dark area. Replace any light bulbs as soon as they burn out.  Set up your furniture so you have a clear path. Avoid moving your furniture around.  If any of your floors are uneven, fix them.  If there are any pets around you, be aware of where they are.  Review your medicines with your doctor. Some medicines can make you feel dizzy. This can increase your chance of falling. Ask your doctor what other things that you can do to help prevent falls. This information is not intended to replace advice given to you by your health care provider. Make sure you discuss any questions you have with your health care provider. Document Released: 10/12/2009 Document Revised: 05/23/2016 Document Reviewed: 01/20/2015 Elsevier Interactive Patient Education  2017 [employer].

## 2019-07-19 NOTE — Assessment & Plan Note (Signed)
Elevated on repeat as well - reviewed lifestyle changes to affect improved BP control including low sodium/salt diet. He will monitor BP at home and let me know if consistently >140/90 to start antihypertensive. He declines scheduling follow up appointment in office, but agrees to notify me if consistently elevated at home.

## 2019-07-19 NOTE — Assessment & Plan Note (Signed)
Chronic, stable. Continue current regimen. 

## 2019-07-19 NOTE — Assessment & Plan Note (Signed)
Chronic, stable on lipitor - continue. The 10-year ASCVD risk score Mikey Bussing DC Brooke Bonito., et al., 2013) is: 19.3%   Values used to calculate the score:     Age: 68 years     Sex: Male     Is Non-Hispanic African American: No     Diabetic: No     Tobacco smoker: No     Systolic Blood Pressure: 381 mmHg     Is BP treated: No     HDL Cholesterol: 61.7 mg/dL     Total Cholesterol: 151 mg/dL

## 2019-07-19 NOTE — Patient Instructions (Addendum)
Bring [place] copy of your living will at your convenience Your goal blood pressure is <140/90. Blood pressure was too high today - start monitoring more closely at home. Let me know if consistently >140/90 and we may need to start medicine to help this. Work on low salt/sodium diet. Eat a diet high in fruits/vegetables and whole grains.  Look into mediterranean and DASH diet. Look at [place] for more resources  Return for office visit if blood pressure staying elevated   Health Maintenance After Age 41 After age 69, you are at a higher risk for certain long-term diseases and infections as well as injuries from falls. Falls are a major cause of broken bones and head injuries in people who are older than age 43. Getting regular preventive care can help to keep you healthy and well. Preventive care includes getting regular testing and making lifestyle changes as recommended by your health care provider. Talk with your health care provider about:  Which screenings and tests you should have. A screening is a test that checks for a disease when you have no symptoms.  A diet and exercise plan that is right for you. What should I know about screenings and tests to prevent falls? Screening and testing are the best ways to find a health problem early. Early diagnosis and treatment give you the best chance of managing medical conditions that are common after age 29. Certain conditions and lifestyle choices may make you more likely to have a fall. Your health care provider may recommend:  Regular vision checks. Poor vision and conditions such as cataracts can make you more likely to have a fall. If you wear glasses, make sure to get your prescription updated if your vision changes.  Medicine review. Work with your health care provider to regularly review all of the medicines you are taking, including over-the-counter medicines. Ask your health care provider about any side effects that may make you more likely  to have a fall. Tell your health care provider if any medicines that you take make you feel dizzy or sleepy.  Osteoporosis screening. Osteoporosis is a condition that causes the bones to get weaker. This can make the bones weak and cause them to break more easily.  Blood pressure screening. Blood pressure changes and medicines to control blood pressure can make you feel dizzy.  Strength and balance checks. Your health care provider may recommend certain tests to check your strength and balance while standing, walking, or changing positions.  Foot health exam. Foot pain and numbness, as well as not wearing proper footwear, can make you more likely to have a fall.  Depression screening. You may be more likely to have a fall if you have a fear of falling, feel emotionally low, or feel unable to do activities that you used to do.  Alcohol use screening. Using too much alcohol can affect your balance and may make you more likely to have a fall. What actions can I take to lower my risk of falls? General instructions  Talk with your health care provider about your risks for falling. Tell your health care provider if: ? You fall. Be sure to tell your health care provider about all falls, even ones that seem minor. ? You feel dizzy, sleepy, or off-balance.  Take over-the-counter and prescription medicines only as told by your health care provider. These include any supplements.  Eat a healthy diet and maintain a healthy weight. A healthy diet includes low-fat dairy products, low-fat (lean) meats,  and fiber from whole grains, beans, and lots of fruits and vegetables. Home safety  Remove any tripping hazards, such as rugs, cords, and clutter.  Install safety equipment such as grab bars in bathrooms and safety rails on stairs.  Keep rooms and walkways well-lit. Activity   Follow a regular exercise program to stay fit. This will help you maintain your balance. Ask your health care provider what  types of exercise are appropriate for you.  If you need a cane or walker, use it as recommended by your health care provider.  Wear supportive shoes that have nonskid soles. Lifestyle  Do not drink alcohol if your health care provider tells you not to drink.  If you drink alcohol, limit how much you have: ? 0-1 drink a day for women. ? 0-2 drinks a day for men.  Be aware of how much alcohol is in your drink. In the [place] one drink equals one typical bottle of beer (12 oz), one-half glass of wine (5 oz), or one shot of hard liquor (1 oz).  Do not use any products that contain nicotine or tobacco, such as cigarettes and e-cigarettes. If you need help quitting, ask your health care provider. Summary  Having a healthy lifestyle and getting preventive care can help to protect your health and wellness after age 75.  Screening and testing are the best way to find a health problem early and help you avoid having a fall. Early diagnosis and treatment give you the best chance for managing medical conditions that are more common for people who are older than age 60.  Falls are a major cause of broken bones and head injuries in people who are older than age 11. Take precautions to prevent a fall at home.  Work with your health care provider to learn what changes you can make to improve your health and wellness and to prevent falls. This information is not intended to replace advice given to you by your health care provider. Make sure you discuss any questions you have with your health care provider. Document Released: 10/29/2017 Document Revised: 04/08/2019 Document Reviewed: 10/29/2017 Elsevier Patient Education  2020 [employer].

## 2019-07-19 NOTE — Progress Notes (Signed)
This visit was conducted in person.  BP (!) 162/90 (BP Location: Left Arm, Patient Position: Sitting, Cuff Size: Normal)   Pulse 74   Temp 98.6 F (37 C) (Temporal)   Ht 5\' 9"  (1.753 m)   Wt 171 lb 12.8 oz (77.9 kg)   SpO2 97%   BMI 25.37 kg/m   On recheck staying elevated BP Readings from Last 3 Encounters:  07/19/19 (!) 162/90  07/15/18 120/76  07/13/18 130/80   CC: CPE Subjective:    Patient ID: Henry Cookey., male    DOB: Nov 17, 1951, 68 y.o.   MRN: [id number]  HPI: Henry Manalang. is a 68 y.o. male presenting on 07/19/2019 for Annual Exam (no new concerns)   Saw Katha Cabal today for medicare wellness visit. Note reviewed.   Ongoing L knee pain flares up for 2-3 wks then improves. Never redness, swelling, warmth of knee. Improves with aleve and compression warp.   Has also noted wart on right sole.   Isolated BP elevation noted today - no troubles when checked at home.   Ongoing trouble with hearing despite aides.    Habitual alcohol use - continues cutting down down from 30 to 24 beers/wk.  Vit B12 deficiency - last B12 shot was 05/2017.  Carotid US 05/2017 - mild plaque - monitoring clinically.   Persistent paresthesias of L hand despite B12 replacement.   Preventative: COLONOSCOPY Date: 01/2016 TA, mod diverticulosis, f/u 5 yrs Henrene Pastor) Prostate cancer screening - PSA reassuring - requests spacing out DRE Lung cancer screening - quit 20 yrs ago Flu shot - yearly Tdap- 03/2014 prevnar 2016, pneumovax2017 Shingles shot - discussed. Declines.  Advanced directive discussion - has at home. HCPOA is wife. Asked to bring [place] copy.  Seat belt use discussed Sunscreen use and skin screen discussed Ex smoker - quit 2000  Alcohol - heavy drinker on weekends (6-8) Dentist Q6 mo Eye exam has not seen regularly Bowel - no constipation Bladder - no incontinence  Married, lives with wife, new cat  1 child  Occ: [employer], retired, now cares for  a few lawns.  Activity: outdoor work  Diet: good water, vegetables daily     Relevant past medical, surgical, family and social history reviewed and updated as indicated. Interim medical history since our last visit reviewed. Allergies and medications reviewed and updated. Outpatient Medications Prior to Visit  Medication Sig Dispense Refill  . aspirin 325 MG tablet Take 325 mg by mouth once a week.     Marland Kitchen atorvastatin (LIPITOR) 40 MG tablet Take 1 tablet (40 mg total) by mouth daily. 90 tablet 3  . levothyroxine (SYNTHROID) 75 MCG tablet TAKE 1 TABLET BY MOUTH EVERY DAY 90 tablet 0  . sildenafil (VIAGRA) 100 MG tablet TAKE 0.5-1 TABLETS (50-100 MG TOTAL) BY MOUTH DAILY AS NEEDED FOR ERECTILE DYSFUNCTION. 10 tablet 3  . vitamin B-12 (V-R VITAMIN B-12) 500 MCG tablet Take 1 tablet (500 mcg total) by mouth daily.     No facility-administered medications prior to visit.      Per HPI unless specifically indicated in ROS section below Review of Systems  Constitutional: Negative for activity change, appetite change, chills, fatigue, fever and unexpected weight change.  HENT: Negative for hearing loss.   Eyes: Negative for visual disturbance.  Respiratory: Negative for cough, chest tightness, shortness of breath and wheezing.   Cardiovascular: Negative for chest pain, palpitations and leg swelling.  Gastrointestinal: Negative for abdominal distention, abdominal pain, blood in stool, constipation,  diarrhea, nausea and vomiting.  Genitourinary: Negative for difficulty urinating and hematuria.  Musculoskeletal: Negative for arthralgias, myalgias and neck pain.  Skin: Negative for rash.  Neurological: Negative for dizziness, seizures, syncope and headaches.  Hematological: Negative for adenopathy. Does not bruise/bleed easily.  Psychiatric/Behavioral: Negative for dysphoric mood. The patient is not nervous/anxious.    Objective:    BP (!) 162/90 (BP Location: Left Arm, Patient Position:  Sitting, Cuff Size: Normal)   Pulse 74   Temp 98.6 F (37 C) (Temporal)   Ht 5\' 9"  (1.753 m)   Wt 171 lb 12.8 oz (77.9 kg)   SpO2 97%   BMI 25.37 kg/m   Wt Readings from Last 3 Encounters:  07/19/19 171 lb 12.8 oz (77.9 kg)  07/19/19 176 lb (79.8 kg)  07/15/18 168 lb 8 oz (76.4 kg)    Physical Exam Vitals signs and nursing note reviewed.  Constitutional:      General: He is not in acute distress.    Appearance: Normal appearance. He is well-developed. He is not ill-appearing.  HENT:     Head: Normocephalic and atraumatic.     Right Ear: Hearing, tympanic membrane, ear canal and external ear normal.     Left Ear: Hearing, tympanic membrane, ear canal and external ear normal.     Nose: Nose normal.     Mouth/Throat:     Mouth: Mucous membranes are moist.     Pharynx: Uvula midline. No oropharyngeal exudate or posterior oropharyngeal erythema.  Eyes:     General: No scleral icterus.    Extraocular Movements: Extraocular movements intact.     Conjunctiva/sclera: Conjunctivae normal.     Pupils: Pupils are equal, round, and reactive to light.  Neck:     Musculoskeletal: Normal range of motion and neck supple.     Vascular: Carotid bruit (L sided) present.  Cardiovascular:     Rate and Rhythm: Normal rate and regular rhythm.     Pulses: Normal pulses.          Radial pulses are 2+ on the right side and 2+ on the left side.     Heart sounds: Normal heart sounds. No murmur.  Pulmonary:     Effort: Pulmonary effort is normal. No respiratory distress.     Breath sounds: Normal breath sounds. No wheezing, rhonchi or rales.  Abdominal:     General: Bowel sounds are normal. There is no distension.     Palpations: Abdomen is soft. There is no mass.     Tenderness: There is no abdominal tenderness. There is no right CVA tenderness, left CVA tenderness, guarding or rebound.     Hernia: No hernia is present.  Musculoskeletal: Normal range of motion.     Right lower leg: No edema.      Left lower leg: No edema.     Comments:  R knee exam WNL L knee exam: No deformity on inspection. No pain with palpation of knee landmarks. No effusion/swelling noted. FROM in flex/extension without crepitus. No popliteal fullness. Neg drawer test. Neg mcmurray test. No pain with valgus/varus stress. No PFgrind. No abnormal patellar mobility.   Lymphadenopathy:     Cervical: No cervical adenopathy.  Skin:    General: Skin is warm and dry.     Findings: No rash.  Neurological:     General: No focal deficit present.     Mental Status: He is alert and oriented to person, place, and time.     Comments: CN grossly intact,  station and gait intact  Psychiatric:        Mood and Affect: Mood normal.        Behavior: Behavior normal.        Thought Content: Thought content normal.        Judgment: Judgment normal.       Results for orders placed or performed in visit on 07/16/19  CBC with Differential/Platelet  Result Value Ref Range   WBC 5.5 4.0 - 10.5 K/uL   RBC 4.54 4.22 - 5.81 Mil/uL   Hemoglobin 14.9 13.0 - 17.0 g/dL   HCT 44.7 39.0 - 52.0 %   MCV 98.4 78.0 - 100.0 fl   MCHC 33.3 30.0 - 36.0 g/dL   RDW 13.3 11.5 - 15.5 %   Platelets 247.0 150.0 - 400.0 K/uL   Neutrophils Relative % 58.1 43.0 - 77.0 %   Lymphocytes Relative 30.7 12.0 - 46.0 %   Monocytes Relative 8.5 3.0 - 12.0 %   Eosinophils Relative 2.1 0.0 - 5.0 %   Basophils Relative 0.6 0.0 - 3.0 %   Neutro Abs 3.2 1.4 - 7.7 K/uL   Lymphs Abs 1.7 0.7 - 4.0 K/uL   Monocytes Absolute 0.5 0.1 - 1.0 K/uL   Eosinophils Absolute 0.1 0.0 - 0.7 K/uL   Basophils Absolute 0.0 0.0 - 0.1 K/uL  Vitamin B12  Result Value Ref Range   Vitamin B-12 821 211 - 911 pg/mL  PSA  Result Value Ref Range   PSA 0.70 0.10 - 4.00 ng/mL  T4, free  Result Value Ref Range   Free T4 0.82 0.60 - 1.60 ng/dL  Hemoglobin A1c  Result Value Ref Range   Hgb A1c MFr Bld 5.9 4.6 - 6.5 %  TSH  Result Value Ref Range   TSH 2.72 0.35 - 4.50  uIU/mL  Comprehensive metabolic panel  Result Value Ref Range   Sodium 137 135 - 145 mEq/L   Potassium 4.8 3.5 - 5.1 mEq/L   Chloride 101 96 - 112 mEq/L   CO2 29 19 - 32 mEq/L   Glucose, Bld 100 (H) 70 - 99 mg/dL   BUN 13 6 - 23 mg/dL   Creatinine, Ser 0.90 0.40 - 1.50 mg/dL   Total Bilirubin 0.8 0.2 - 1.2 mg/dL   Alkaline Phosphatase 71 39 - 117 U/L   AST 16 0 - 37 U/L   ALT 14 0 - 53 U/L   Total Protein 6.7 6.0 - 8.3 g/dL   Albumin 4.4 3.5 - 5.2 g/dL   Calcium 9.2 8.4 - 10.5 mg/dL   GFR 83.45 >60.00 mL/min  Lipid panel  Result Value Ref Range   Cholesterol 151 0 - 200 mg/dL   Triglycerides 44.0 0.0 - 149.0 mg/dL   HDL 61.70 >39.00 mg/dL   VLDL 8.8 0.0 - 40.0 mg/dL   LDL Cholesterol 80 0 - 99 mg/dL   Total CHOL/HDL Ratio 2    NonHDL 89.12    Assessment & Plan:   Problem List Items Addressed This Visit    Vitamin B12 deficiency    Levels repleted on daily replacement.       Prediabetes    Chronic, stable. Encouraged avoiding added sugars.      Peripheral neuropathy - Primary    Ongoing. B12 repleted. Ongoing alcohol use.       Left knee pain    Anticipate intermittent flares of acute osteoarthritis. Will monitor for now.       Hypothyroidism    Chronic,  stable. Continue current regimen.       HLD (hyperlipidemia)    Chronic, stable on lipitor - continue. The 10-year ASCVD risk score Mikey Bussing DC Brooke Bonito., et al., 2013) is: 19.3%   Values used to calculate the score:     Age: 60 years     Sex: Male     Is Non-Hispanic African American: No     Diabetic: No     Tobacco smoker: No     Systolic Blood Pressure: 056 mmHg     Is BP treated: No     HDL Cholesterol: 61.7 mg/dL     Total Cholesterol: 151 mg/dL       Health maintenance examination    Preventative protocols reviewed and updated unless pt declined. Discussed healthy diet and lifestyle.       Habitual alcohol use    Continue to encourage decreased use. He is down to 24 beers/wk.       Elevated blood  pressure reading in office without diagnosis of hypertension    Elevated on repeat as well - reviewed lifestyle changes to affect improved BP control including low sodium/salt diet. He will monitor BP at home and let me know if consistently >140/90 to start antihypertensive. He declines scheduling follow up appointment in office, but agrees to notify me if consistently elevated at home.       Carotid stenosis, asymptomatic    L carotid bruit persists however carotid US from 2018 with only mild disease with rec f/u PRN. Pt denies symptoms.       Advanced care planning/counseling discussion    Advanced directive discussion - has at home. HCPOA is wife. Asked to bring [place] copy.           No orders of the defined types were placed in this encounter.  No orders of the defined types were placed in this encounter.   Follow up plan: Return in about 1 year (around 07/18/2020) for annual exam, prior fasting for blood work.  Ria Bush, MD

## 2019-07-19 NOTE — Assessment & Plan Note (Signed)
Continue to encourage decreased use. He is down to 24 beers/wk.

## 2019-07-19 NOTE — Progress Notes (Signed)
Subjective:   Edker Punt. is a 68 y.o. male who presents for Medicare Annual/Subsequent preventive examination.  This visit type was conducted due to national recommendations for restrictions regarding the COVID-19 Pandemic (e.g. social distancing). This format is felt to be most appropriate for this patient at this time. All issues noted in this document were discussed and addressed. No physical exam was performed (except for noted visual exam findings with Video Visits). This patient,Mr. Toy Cookey and his wife Mrs. Jonelle Sidle, has given permission to perform this visit via telephone. Vital signs may be absent or patient reported.  Patient location:  At home  Nurse location:  At home     Review of Systems:  n/a Cardiac Risk Factors include: advanced age (>60men, >77 women);dyslipidemia;male gender     Objective:    Vitals: Pulse 72 Comment: per patient  Temp (!) 97.2 F (36.2 C) Comment: per patient  Ht 5\' 9"  (1.753 m) Comment: per patient  Wt 176 lb (79.8 kg) Comment: per patient  BMI 25.99 kg/m   Body mass index is 25.99 kg/m.  Advanced Directives 07/19/2019 07/13/2018 05/21/2017 05/15/2016 02/06/2016 01/23/2016  Does Patient Have a Medical Advance Directive? Yes No Yes Yes Yes Yes  Type of Paramedic of [place];Living will - Living will [place];Living will [place];Living will Living will;Healthcare Power of Attorney  Does patient want to make changes to medical advance directive? No - Patient declined - - No - Patient declined - -  Copy of [place] in Chart? No - copy requested - - No - copy requested - -  Would patient like information on creating a medical advance directive? - No - Patient declined - - - -    Tobacco Social History   Tobacco Use  Smoking Status Former Smoker  . Quit date: 12/30/1998  . Years since quitting: 20.5  Smokeless Tobacco Never Used      Counseling given: Not Answered   Clinical Intake:  Pre-visit preparation completed: Yes  Pain : 0-10 Pain Score: 1  Pain Type: Acute pain Pain Location: Shoulder Pain Orientation: Right Pain Radiating Towards: muscle Pain Descriptors / Indicators: Aching Pain Onset: 1 to 4 weeks ago Pain Frequency: Intermittent Pain Relieving Factors: heat helped Effect of Pain on Daily Activities: none  Pain Relieving Factors: heat helped  Nutritional Status: BMI 25 -29 Overweight Nutritional Risks: None Diabetes: No  How often do you need to have someone help you when you read instructions, pamphlets, or other written materials from your doctor or pharmacy?: 1 - Never What is the last grade level you completed in school?: college  Interpreter Needed?: No  Information entered by :: NAllen LPN  Past Medical History:  Diagnosis Date  . Carotid stenosis, asymptomatic 2016   1-39% B, rpt 2 yrs  . Cataract   . Cyst of right kidney 2015   2cm, stable on rpt [place]  . DJD (degenerative joint disease), lumbar   . HLD (hyperlipidemia)    Borderline  . HTN (hypertension)   . Hypothyroid   . Lyme disease 05/18/2013  . Nephrolithiasis 02/2014   Past Surgical History:  Procedure Laterality Date  . COLONOSCOPY  10/28/01   with polypectomy  . COLONOSCOPY  10/19/2004   Divertics; no polyps  . COLONOSCOPY  11/20/10   Normal-Dr. Henrene Pastor  . COLONOSCOPY  01/2016   TA, mod diverticulosis, f/u 5 yrs Henrene Pastor)  . FOREIGN BODY REMOVAL Left 2007  fifth finger   Family History  Problem Relation Age of Onset  . Colon cancer Mother 62  . Congestive Heart Failure Mother   . Hypertension Father   . Thyroid disease Father   . Alcohol abuse Brother   . Heart disease Brother   . Thyroid disease Sister   . Cancer Paternal Uncle        prostate  . Thyroid disease Brother    Social History   Socioeconomic History  . Marital status: Married    Spouse name: Not on file  . Number of children: 1  .  Years of education: Not on file  . Highest education level: Not on file  Occupational History  . Occupation: Presenter, broadcasting: [employer]  Social Needs  . Financial resource strain: Not on file  . Food insecurity    Worry: Not on file    Inability: Not on file  . Transportation needs    Medical: Not on file    Non-medical: Not on file  Tobacco Use  . Smoking status: Former Smoker    Quit date: 12/30/1998    Years since quitting: 20.5  . Smokeless tobacco: Never Used  Substance and Sexual Activity  . Alcohol use: Yes    Alcohol/week: 32.0 standard drinks    Types: 32 Cans of beer per week  . Drug use: No  . Sexual activity: Yes  Lifestyle  . Physical activity    Days per week: Not on file    Minutes per session: Not on file  . Stress: Not on file  Relationships  . Social Herbalist on phone: Not on file    Gets together: Not on file    Attends religious service: Not on file    Active member of club or organization: Not on file    Attends meetings of clubs or organizations: Not on file    Relationship status: Not on file  Other Topics Concern  . 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 aspirin 325 MG tablet Take 325 mg by mouth once a week.   Marland Kitchen atorvastatin (LIPITOR) 40 MG tablet Take 1 tablet (40 mg total) by mouth daily.  Marland Kitchen levothyroxine (SYNTHROID) 75 MCG tablet TAKE 1 TABLET BY MOUTH EVERY DAY  . sildenafil (VIAGRA) 100 MG tablet TAKE 0.5-1 TABLETS (50-100 MG TOTAL) BY MOUTH DAILY AS NEEDED FOR ERECTILE DYSFUNCTION.  . vitamin B-12 (V-R VITAMIN B-12) 500 MCG tablet Take 1 tablet (500 mcg total) by mouth daily.   No facility-administered encounter medications on file as of 07/19/2019.     Activities of Daily Living In your present  state of health, do you have any difficulty performing the following activities: 07/19/2019  Hearing? Y  Comment wears hearing aides  Vision? N  Difficulty concentrating or making decisions? N  Walking or climbing stairs? N  Dressing or bathing? N  Doing errands, shopping? N  Preparing Food and eating ? N  Using the Toilet? N  In the past six months, have you accidently leaked urine? N  Do you have problems with loss of bowel control? N  Managing your Medications? N  Managing your Finances? N  Housekeeping or managing your Housekeeping? N  Some recent data might be hidden    Patient Care Team: Ria Bush, MD as PCP - General (Family Medicine) Macario Carls, MS, CCC-A as Referring Physician (Audiology) Lyla Glassing, MD as Referring Physician (Ophthalmology) Rosalene Billings., MD as Referring Physician (Dentistry)   Assessment:   This is a routine wellness examination for Hendricks.  Exercise Activities and Dietary recommendations Current Exercise Habits: The patient does not participate in regular exercise at present(works out in the yard)  Goals    . Increase physical activity     Starting 05/21/2017, I will continue to do yard work and chop wood for at least 3-4 hrs for 6 days per week as weather permits.     . Increase physical activity     Starting 07/13/2018, I will continue to do yard work at least 4 hours daily.     . Patient Stated     Continue with yard work daily       Fall Risk Fall Risk  07/19/2019 07/13/2018 05/21/2017 05/15/2016 03/28/2015  Falls in the past year? 0 No No No Yes  Injury with Fall? - - - - No  Risk for fall due to : Medication side effect - - - -  Follow up Falls evaluation completed;Falls prevention discussed - - - -   Is the patient's home free of loose throw rugs in walkways, pet beds, electrical cords, etc?   yes      Grab bars in the bathroom? no      Handrails on the stairs?   yes      Adequate lighting?   yes  Timed Get Up and  Go Performed: n/a  Depression Screen PHQ 2/9 Scores 07/19/2019 07/13/2018 05/21/2017 05/15/2016  PHQ - 2 Score 0 0 0 0  PHQ- 9 Score 0 0 - -    Cognitive Function MMSE - Mini Mental State Exam 07/19/2019 07/13/2018 05/21/2017 05/15/2016  Orientation to time 5 5 5 5   Orientation to Place 5 5 5 5   Registration 3 3 3 3   Attention/ Calculation 3 0 0 0  Recall 3 3 3 3   Language- name 2 objects 0 0 0 0  Language- repeat 1 1 1 1   Language- follow 3 step command 0 3 3 3   Language- read & follow direction 0 0 0 0  Write a sentence 0 0 0 0  Copy design 0 0 0 0  Total score 20 20 20 20    Mini Cog  Mini-Cog screen was completed. Maximum score is 22. A value of 0 denotes this part of the MMSE was not completed or the patient failed this part of the Mini-Cog screening.       Immunization History  Administered Date(s) Administered  . Influenza Whole 11/12/2005, 01/14/2013  . Influenza, High Dose Seasonal PF 09/11/2017  . Influenza,inj,Quad PF,6+ Mos 10/05/2014, 09/19/2016  . Influenza-Unspecified 09/30/2013  . Pneumococcal Conjugate-13 03/28/2015  . Pneumococcal Polysaccharide-23 05/22/2016  . Td 09/03/2001  . Tdap 03/31/2014    Qualifies for Shingles Vaccine?  yes  Screening Tests Health Maintenance  Topic Date Due  . INFLUENZA VACCINE  07/31/2019  . COLONOSCOPY  02/05/2021  . DTaP/Tdap/Td (2 - Td) 03/31/2024  . TETANUS/TDAP  03/31/2024  . Hepatitis C Screening  Completed  . PNA vac Low Risk Adult  Completed   Cancer Screenings: Lung: Low Dose CT Chest recommended if Age 58-80 years, 30 pack-year currently smoking OR have quit w/in 15years. Patient does not qualify. Colorectal: up to date  Additional Screenings:  Hepatitis C Screening:04/2016      Plan:    Wants to continue working out in the yard daily.  I have personally reviewed and noted the following in the patient's chart:   . Medical and social history . Use of alcohol, tobacco or illicit drugs  . Current  medications and supplements . Functional ability and status . Nutritional status . Physical activity . Advanced directives . List of other physicians . Hospitalizations, surgeries, and ER visits in previous 12 months . Vitals . Screenings to include cognitive, depression, and falls . Referrals and appointments  In addition, I have reviewed and discussed with patient certain preventive protocols, quality metrics, and best practice recommendations. A written personalized care plan for preventive services as well as general preventive health recommendations were provided to patient.     Kellie Simmering, LPN  [id number]

## 2019-07-19 NOTE — Assessment & Plan Note (Signed)
Ongoing. B12 repleted. Ongoing alcohol use.

## 2019-07-19 NOTE — Assessment & Plan Note (Signed)
Advanced directive discussion - has at home. HCPOA is wife. Asked to bring [place] copy.

## 2019-08-02 ENCOUNTER — Telehealth: Payer: Self-pay

## 2019-08-02 MED ORDER — AMLODIPINE BESYLATE 5 MG PO TABS: 5.0000 mg | ORAL_TABLET | Freq: Every day | ORAL | 6 refills | Status: DC

## 2019-08-02 NOTE — Telephone Encounter (Addendum)
I spoke with Henry Baldwin (DPR signed) she said pt was mowing yards so he could not ck his BP now. Henry Baldwin said 08/02/19 early AM BP was 112/100 (?) 08/02/19 at 730 AM BP 133/100 08/01/19 at 845 AM BP 171/104 07/31/19 at 6:30 AM BP 173/91         9:30 AM BP 157/93                    4:00 PM BP 173/94 07/30/19 BP 157/90                BP 141/86 07/23/19 BP 165/84                BP 156/104  Pt last seen 07/19/19 and was advised to monitor BP and if consistently over 140/90 was to cb for med. Before each BP reading pt was at rest prior to BP being taken. Pt is not on BP medication; pt is not using extra salt on food.No H/A,dizziness,CP or SOB. Pt wants to know if can take sildenafil while taking new BP med. [facility].

## 2019-08-02 NOTE — Telephone Encounter (Signed)
Spoke with pt and wife, Henry Baldwin, relaying Dr. Synthia Innocent message.  Verbalize understanding.

## 2019-08-02 NOTE — Telephone Encounter (Signed)
I have sent in amlodipine 5mg  for him to take. Watch for ankle swelling on this med. Call us in 2-3 wks with an update on blood pressures, may need to increase to full dose at that time.  Ok to take sildenafil while on BP med.

## 2019-08-30 ENCOUNTER — Other Ambulatory Visit: Payer: Self-pay | Admitting: Family Medicine

## 2019-09-09 DIAGNOSIS — R69 Illness, unspecified: Secondary | ICD-10-CM | POA: Diagnosis not present

## 2020-01-20 ENCOUNTER — Ambulatory Visit: Payer: Medicare HMO | Attending: Internal Medicine

## 2020-01-20 DIAGNOSIS — Z23 Encounter for immunization: Secondary | ICD-10-CM | POA: Insufficient documentation

## 2020-01-20 NOTE — Progress Notes (Signed)
   [id number]  Name:  Henry Baldwin.    MRN: [id number] DOB: 10/31/1951  01/20/2020  [name] was observed post Covid-19 immunization for 15 minutes without incidence. He was provided with Vaccine Information Sheet and instruction to access the V-Safe system.   [name] was instructed to call 911 with any severe reactions post vaccine: Marland Kitchen Difficulty breathing  . Swelling of your face and throat  . A fast heartbeat  . A bad rash all over your body  . Dizziness and weakness    Immunizations Administered    Name Date Dose VIS Date Route   Pfizer COVID-19 Vaccine 01/20/2020 10:11 AM 0.3 mL 12/10/2019 Intramuscular   Manufacturer: [place]   Lot: [id number]   [place]: [id number]

## 2020-02-10 ENCOUNTER — Ambulatory Visit: Payer: Medicare HMO | Attending: Internal Medicine

## 2020-02-10 DIAGNOSIS — Z23 Encounter for immunization: Secondary | ICD-10-CM | POA: Insufficient documentation

## 2020-02-10 NOTE — Progress Notes (Signed)
   [id number]  Name:  Henry Baldwin.    MRN: [id number] DOB: [birth year]  02/10/2020  Mr. Henry Baldwin was observed post Covid-19 immunization for 15 minutes without incidence. He was provided with Vaccine Information Sheet and instruction to access the V-Safe system.   Henry Baldwin was instructed to call 911 with any severe reactions post vaccine: Marland Kitchen Difficulty breathing  . Swelling of your face and throat  . A fast heartbeat  . A bad rash all over your body  . Dizziness and weakness    Immunizations Administered    Name Date Dose VIS Date Route   Pfizer COVID-19 Vaccine 02/10/2020 10:28 AM 0.3 mL 12/10/2019 Intramuscular   Manufacturer: [place]   Lot: [place] [id number]   [place]: [id number]

## 2020-02-17 ENCOUNTER — Other Ambulatory Visit: Payer: Self-pay | Admitting: Family Medicine

## 2020-03-06 DIAGNOSIS — M9903 Segmental and somatic dysfunction of lumbar region: Secondary | ICD-10-CM | POA: Diagnosis not present

## 2020-03-06 DIAGNOSIS — M62830 Muscle spasm of back: Secondary | ICD-10-CM | POA: Diagnosis not present

## 2020-03-06 DIAGNOSIS — M5137 Other intervertebral disc degeneration, lumbosacral region: Secondary | ICD-10-CM | POA: Diagnosis not present

## 2020-03-06 DIAGNOSIS — M545 Low back pain: Secondary | ICD-10-CM | POA: Diagnosis not present

## 2020-03-08 DIAGNOSIS — M5137 Other intervertebral disc degeneration, lumbosacral region: Secondary | ICD-10-CM | POA: Diagnosis not present

## 2020-03-08 DIAGNOSIS — M62830 Muscle spasm of back: Secondary | ICD-10-CM | POA: Diagnosis not present

## 2020-03-08 DIAGNOSIS — M9903 Segmental and somatic dysfunction of lumbar region: Secondary | ICD-10-CM | POA: Diagnosis not present

## 2020-03-08 DIAGNOSIS — M545 Low back pain: Secondary | ICD-10-CM | POA: Diagnosis not present

## 2020-03-10 DIAGNOSIS — M545 Low back pain: Secondary | ICD-10-CM | POA: Diagnosis not present

## 2020-03-10 DIAGNOSIS — M62830 Muscle spasm of back: Secondary | ICD-10-CM | POA: Diagnosis not present

## 2020-03-10 DIAGNOSIS — M9903 Segmental and somatic dysfunction of lumbar region: Secondary | ICD-10-CM | POA: Diagnosis not present

## 2020-03-10 DIAGNOSIS — M5137 Other intervertebral disc degeneration, lumbosacral region: Secondary | ICD-10-CM | POA: Diagnosis not present

## 2020-03-13 DIAGNOSIS — M9903 Segmental and somatic dysfunction of lumbar region: Secondary | ICD-10-CM | POA: Diagnosis not present

## 2020-03-13 DIAGNOSIS — M5137 Other intervertebral disc degeneration, lumbosacral region: Secondary | ICD-10-CM | POA: Diagnosis not present

## 2020-03-13 DIAGNOSIS — M545 Low back pain: Secondary | ICD-10-CM | POA: Diagnosis not present

## 2020-03-13 DIAGNOSIS — M62830 Muscle spasm of back: Secondary | ICD-10-CM | POA: Diagnosis not present

## 2020-03-14 DIAGNOSIS — M5137 Other intervertebral disc degeneration, lumbosacral region: Secondary | ICD-10-CM | POA: Diagnosis not present

## 2020-03-14 DIAGNOSIS — M545 Low back pain: Secondary | ICD-10-CM | POA: Diagnosis not present

## 2020-03-14 DIAGNOSIS — M9903 Segmental and somatic dysfunction of lumbar region: Secondary | ICD-10-CM | POA: Diagnosis not present

## 2020-03-14 DIAGNOSIS — M62830 Muscle spasm of back: Secondary | ICD-10-CM | POA: Diagnosis not present

## 2020-03-15 DIAGNOSIS — R69 Illness, unspecified: Secondary | ICD-10-CM | POA: Diagnosis not present

## 2020-03-17 DIAGNOSIS — M62830 Muscle spasm of back: Secondary | ICD-10-CM | POA: Diagnosis not present

## 2020-03-17 DIAGNOSIS — M5137 Other intervertebral disc degeneration, lumbosacral region: Secondary | ICD-10-CM | POA: Diagnosis not present

## 2020-03-17 DIAGNOSIS — M9903 Segmental and somatic dysfunction of lumbar region: Secondary | ICD-10-CM | POA: Diagnosis not present

## 2020-03-17 DIAGNOSIS — M545 Low back pain: Secondary | ICD-10-CM | POA: Diagnosis not present

## 2020-03-20 ENCOUNTER — Encounter: Payer: Self-pay | Admitting: Family Medicine

## 2020-03-20 ENCOUNTER — Other Ambulatory Visit: Payer: Self-pay

## 2020-03-20 ENCOUNTER — Ambulatory Visit (INDEPENDENT_AMBULATORY_CARE_PROVIDER_SITE_OTHER): Payer: Medicare HMO | Admitting: Family Medicine

## 2020-03-20 ENCOUNTER — Ambulatory Visit
Admission: RE | Admit: 2020-03-20 | Discharge: 2020-03-20 | Disposition: A | Discharge status: Home / Self Care | Payer: Medicare HMO | Source: Ambulatory Visit | Attending: Family Medicine | Admitting: Family Medicine

## 2020-03-20 ENCOUNTER — Ambulatory Visit (INDEPENDENT_AMBULATORY_CARE_PROVIDER_SITE_OTHER)
Admission: RE | Admit: 2020-03-20 | Discharge: 2020-03-20 | Disposition: A | Discharge status: Home / Self Care | Payer: Medicare HMO | Source: Ambulatory Visit | Attending: Family Medicine | Admitting: Family Medicine

## 2020-03-20 VITALS — BP 134/62 | HR 64 | Temp 97.8°F | Ht 69.0 in | Wt 174.2 lb

## 2020-03-20 DIAGNOSIS — M545 Low back pain, unspecified: Secondary | ICD-10-CM | POA: Insufficient documentation

## 2020-03-20 DIAGNOSIS — R109 Unspecified abdominal pain: Secondary | ICD-10-CM | POA: Diagnosis not present

## 2020-03-20 DIAGNOSIS — N200 Calculus of kidney: Secondary | ICD-10-CM

## 2020-03-20 LAB — POC URINALSYSI DIPSTICK (AUTOMATED)
Bilirubin, UA: NEGATIVE
Glucose, UA: NEGATIVE
Ketones, UA: NEGATIVE
Leukocytes, UA: NEGATIVE
Nitrite, UA: NEGATIVE
Protein, UA: NEGATIVE
Spec Grav, UA: 1.02
Urobilinogen, UA: 0.2 U/dL
pH, UA: 6

## 2020-03-20 MED ORDER — ACETAMINOPHEN-CODEINE 300-30 MG PO TABS: 1.0000 | ORAL_TABLET | ORAL | 0 refills | Status: DC | PRN

## 2020-03-20 MED ORDER — ACETAMINOPHEN-CODEINE 300-30 MG PO TABS: 1.0000 | ORAL_TABLET | Freq: Three times a day (TID) | ORAL | 0 refills | Status: DC | PRN

## 2020-03-20 MED ORDER — TAMSULOSIN HCL 0.4 MG PO CAPS: 0.4000 mg | ORAL_CAPSULE | Freq: Every day | ORAL | 0 refills | Status: DC

## 2020-03-20 NOTE — Progress Notes (Signed)
This visit was conducted in person.  BP 134/62 (BP Location: Left Arm, Patient Position: Sitting, Cuff Size: Normal)   Pulse 64   Temp 97.8 F (36.6 C) (Temporal)   Ht 5\' 9"  (1.753 m)   Wt 174 lb 4 oz (79 kg)   SpO2 99%   BMI 25.73 kg/m    CC: R lower back pain Subjective:    Patient ID: Henry Baldwin., male    DOB: 05-15-1951, 69 y.o.   MRN: [id number]  HPI: Henry Baldwin. is a 69 y.o. male presenting on 03/20/2020 for Back Pain (C/o low right side back pain.  Started about 3 wks ago.  Had severe pain episode on 03/17/20 causing vomiting.  Has seen dark coloration in toilet after urination. H/o kidney stone.  Pain feels the same. Seen by chiropractor, not helpful. )   3 wk h/o R lower back pain - thought he pulled muscle (muscle relaxant didn't help), acutely worse last Friday felt like twisting pain leading to R flank to vomiting x5. This episode lasted 4 hours. Initially started as sharp pains, now more dull ache. Urine was clear this morning but was darker over the last few days. No radiation of pain.   Had treatment by chiropractor (6 sessions) without benefit. Hasn't tried anything else for pain. This feels like previous kidney stone - brings stone composition lab report - cal ox stone.   Takes full dose aspirin 325mg  about once a week - takes for headaches when he starts drinking (four 6-packs per week).   No fevers/chills, dysuria, urgency or frequency. No incomplete emptying. No bowel changes of diarrhea or constipation.       Relevant past medical, surgical, family and social history reviewed and updated as indicated. Interim medical history since our last visit reviewed. Allergies and medications reviewed and updated. Outpatient Medications Prior to Visit  Medication Sig Dispense Refill  . amLODipine (NORVASC) 5 MG tablet TAKE 1 TABLET BY MOUTH EVERY DAY 90 tablet 1  . aspirin 325 MG tablet Take 325 mg by mouth once a week.     Marland Kitchen atorvastatin (LIPITOR) 40 MG  tablet TAKE 1 TABLET BY MOUTH EVERY DAY 90 tablet 3  . levothyroxine (SYNTHROID) 75 MCG tablet TAKE 1 TABLET BY MOUTH EVERY DAY 90 tablet 3  . sildenafil (VIAGRA) 100 MG tablet TAKE 0.5-1 TABLETS (50-100 MG TOTAL) BY MOUTH DAILY AS NEEDED FOR ERECTILE DYSFUNCTION. 10 tablet 3  . vitamin B-12 (V-R VITAMIN B-12) 500 MCG tablet Take 1 tablet (500 mcg total) by mouth daily.     No facility-administered medications prior to visit.     Per HPI unless specifically indicated in ROS section below Review of Systems Objective:    BP 134/62 (BP Location: Left Arm, Patient Position: Sitting, Cuff Size: Normal)   Pulse 64   Temp 97.8 F (36.6 C) (Temporal)   Ht 5\' 9"  (1.753 m)   Wt 174 lb 4 oz (79 kg)   SpO2 99%   BMI 25.73 kg/m   Wt Readings from Last 3 Encounters:  03/20/20 174 lb 4 oz (79 kg)  07/19/19 171 lb 12.8 oz (77.9 kg)  07/19/19 176 lb (79.8 kg)    Physical Exam Vitals and nursing note reviewed.  Constitutional:      Appearance: Normal appearance. He is not ill-appearing.  Eyes:     Extraocular Movements: Extraocular movements intact.     Pupils: Pupils are equal, round, and reactive to light.  Cardiovascular:  Rate and Rhythm: Normal rate and regular rhythm.     Pulses: Normal pulses.     Heart sounds: Normal heart sounds. No murmur.  Pulmonary:     Effort: Pulmonary effort is normal. No respiratory distress.     Breath sounds: Normal breath sounds. No wheezing, rhonchi or rales.  Abdominal:     General: Abdomen is flat. Bowel sounds are normal. There is no distension.     Palpations: Abdomen is soft. There is no mass.     Tenderness: There is no abdominal tenderness. There is no right CVA tenderness, left CVA tenderness, guarding or rebound.     Hernia: No hernia is present.  Musculoskeletal:     Right lower leg: No edema.     Left lower leg: No edema.     Comments:  No pain midline spine No paraspinous mm tenderness No reproducible pain to palpation     Neurological:     Mental Status: He is alert.  Psychiatric:        Mood and Affect: Mood normal.        Behavior: Behavior normal.       Results for orders placed or performed in visit on 03/20/20  POCT Urinalysis Dipstick (Automated)  Result Value Ref Range   Color, UA light yellow    Clarity, UA clear    Glucose, UA Negative Negative   Bilirubin, UA negativ    Ketones, UA negative    Spec Grav, UA 1.020 1.010 - 1.025   Blood, UA +/-    pH, UA 6.0 5.0 - 8.0   Protein, UA Negative Negative   Urobilinogen, UA 0.2 0.2 or 1.0 E.U./dL   Nitrite, UA negative    Leukocytes, UA Negative Negative   Lab Results  Component Value Date   CREATININE 0.90 07/16/2019   BUN 13 07/16/2019   NA 137 07/16/2019   K 4.8 07/16/2019   CL 101 07/16/2019   CO2 29 07/16/2019    DG Abd 1 View CLINICAL DATA:  Right flank pain  EXAM: ABDOMEN - 1 VIEW  COMPARISON:  CT abdomen and pelvis March 04, 2014  FINDINGS: There are suspected phleboliths in the pelvis. There are foci of arterial vascular calcification in the pelvis. No other abnormal calcifications are evident. There is moderate stool in the colon. There is no bowel dilatation or air-fluid level to suggest bowel obstruction. No free air. Visualized lung bases are clear. There is degenerative change in the lumbar spine.  IMPRESSION: Suspect phleboliths in the pelvis. Arterial vascular calcification pelvis is well. No other abnormal calcifications.  No bowel obstruction or free air.  Visualized lung bases clear.  Electronically Signed   By: Lowella Grip III M.D.   On: 03/20/2020 10:07   Assessment & Plan:  This visit occurred during the SARS-CoV-2 public health emergency.  Safety protocols were in place, including screening questions prior to the visit, additional usage of staff PPE, and extensive cleaning of exam room while observing appropriate contact time as indicated for disinfecting solutions.   Problem List Items  Addressed This Visit    Nephrolithiasis    H/o this - last kidney stone did have mild associated hydronephrosis and hydroureter (right side).       Relevant Medications   Acetaminophen-Codeine (TYLENOL/CODEINE #3) 300-30 MG tablet   Acute right-sided low back pain without sciatica - Primary    Story suspicious for recurrent kidney stone. Check KUB today - no obvious stone. Treat as such with flomax and  NSAID. Tylenol #3 for breakthrough pain. He has strainer at home he will start using. If not improving with treatment, low threshold to further image. Pt agrees with plan.       Relevant Medications   Acetaminophen-Codeine (TYLENOL/CODEINE #3) 300-30 MG tablet   Other Relevant Orders   POCT Urinalysis Dipstick (Automated) (Completed)   DG Abd 1 View (Completed)       Meds ordered this encounter  Medications  . tamsulosin (FLOMAX) 0.4 MG CAPS capsule    Sig: Take 1 capsule (0.4 mg total) by mouth daily.    Dispense:  10 capsule    Refill:  0  . DISCONTD: Acetaminophen-Codeine (TYLENOL/CODEINE #3) 300-30 MG tablet    Sig: Take 1 tablet by mouth every 4 (four) hours as needed for pain.    Dispense:  30 tablet    Refill:  0  . Acetaminophen-Codeine (TYLENOL/CODEINE #3) 300-30 MG tablet    Sig: Take 1 tablet by mouth 3 (three) times daily as needed for pain.    Dispense:  15 tablet    Refill:  0    Use this #   Orders Placed This Encounter  Procedures  . DG Abd 1 View    Standing Status:   Future    Number of Occurrences:   1    Standing Expiration Date:   05/20/2021    Order Specific Question:   Reason for Exam (SYMPTOM  OR DIAGNOSIS REQUIRED)    Answer:   eval R kidney stone for R flank pain in h/o kidney stones    Order Specific Question:   Preferred imaging location?    Answer:   [facility]    Order Specific Question:   Radiology Contrast Protocol - do NOT remove file path    Answer:   [email or web address]  . POCT Urinalysis  Dipstick (Automated)    Patient instructions: I don't see obvious stone on xrays but there was a lot of stool ?constipation  We will treat as possible kidney stone with flomax course and ibuprofen 600mg  three times a day with meals for next 5 days.  May use tylenol with codeine for breakthrough pain.  Also start miralax 1/2-1 capful in 8oz of water daily, hold for diarrhea.  If not improving, let us know to consider CT scan.  Start straining urine, let us know if you pass a kidney stone.   Follow up plan: Return if symptoms worsen or fail to improve.  Ria Bush, MD

## 2020-03-20 NOTE — Assessment & Plan Note (Addendum)
Story suspicious for recurrent kidney stone. Check KUB today - no obvious stone. Treat as such with flomax and NSAID. Tylenol #3 for breakthrough pain. He has strainer at home he will start using. If not improving with treatment, low threshold to further image. Pt agrees with plan.

## 2020-03-20 NOTE — Assessment & Plan Note (Addendum)
H/o this - last kidney stone did have mild associated hydronephrosis and hydroureter (right side).

## 2020-03-20 NOTE — Patient Instructions (Addendum)
I don't see obvious stone on xrays but there was a lot of stool ?constipation  We will treat as possible kidney stone with flomax course and ibuprofen 600mg  three times a day with meals for next 5 days.  May use tylenol with codeine for breakthrough pain.  Also start miralax 1/2-1 capful in 8oz of water daily, hold for diarrhea.  If not improving, let us know to consider CT scan.  Start straining urine, let us know if you pass a kidney stone.   Kidney Stones  Kidney stones are solid, rock-like deposits that form inside of the kidneys. The kidneys are a pair of organs that make urine. A kidney stone may form in a kidney and move into other parts of the urinary tract, including the tubes that connect the kidneys to the bladder (ureters), the bladder, and the tube that carries urine out of the body (urethra). As the stone moves through these areas, it can cause intense pain and block the flow of urine. Kidney stones are created when high levels of certain minerals are found in the urine. The stones are usually passed out of the body through urination, but in some cases, medical treatment may be needed to remove them. What are the causes? Kidney stones may be caused by:  A condition in which certain glands produce too much parathyroid hormone (primary hyperparathyroidism), which causes too much calcium buildup in the blood.  A buildup of uric acid crystals in the bladder (hyperuricosuria). Uric acid is a chemical that the body produces when you eat certain foods. It usually exits the body in the urine.  Narrowing (stricture) of one or both of the ureters.  A kidney blockage that is present at birth (congenital obstruction).  Past surgery on the kidney or the ureters, such as gastric bypass surgery. What increases the risk? The following factors may make you more likely to develop this condition:  Having had a kidney stone in the past.  Having a family history of kidney stones.  Not drinking  enough water.  Eating a diet that is high in protein, salt (sodium), or sugar.  Being overweight or obese. What are the signs or symptoms? Symptoms of a kidney stone may include:  Pain in the side of the abdomen, right below the ribs (flank pain). Pain usually spreads (radiates) to the groin.  Needing to urinate frequently or urgently.  Painful urination.  Blood in the urine (hematuria).  Nausea.  Vomiting.  Fever and chills. How is this diagnosed? This condition may be diagnosed based on:  Your symptoms and medical history.  A physical exam.  Blood tests.  Urine tests. These may be done before and after the stone passes out of your body through urination.  Imaging tests, such as a CT scan, abdominal X-ray, or ultrasound.  A procedure to examine the inside of the bladder (cystoscopy). How is this treated? Treatment for kidney stones depends on the size, location, and makeup of the stones. Kidney stones will often pass out of the body through urination. You may need to:  Increase your fluid intake to help pass the stone. In some cases, you may be given fluids through an IV and may need to be monitored at the hospital.  Take medicine for pain.  Make changes in your diet to help prevent kidney stones from coming back. Sometimes, medical procedures are needed to remove a kidney stone. This may involve:  A procedure to break up kidney stones using: ? A focused beam  of light (laser therapy). ? Shock waves (extracorporeal shock wave lithotripsy).  Surgery to remove kidney stones. This may be needed if you have severe pain or have stones that block your urinary tract. Follow these instructions at home: Medicines  Take over-the-counter and prescription medicines only as told by your health care provider.  Ask your health care provider if the medicine prescribed to you requires you to avoid driving or using heavy machinery. Eating and drinking  Drink enough fluid to  keep your urine pale yellow. You may be instructed to drink at least 8-10 glasses of water each day. This will help you pass the kidney stone.  If directed, change your diet. This may include: ? Limiting how much sodium you eat. ? Eating more fruits and vegetables. ? Limiting how much animal protein--such as red meat, poultry, fish, and eggs--you eat.  Follow instructions from your health care provider about eating or drinking restrictions. General instructions  Collect urine samples as told by your health care provider. You may need to collect a urine sample: ? 24 hours after you pass the stone. ? 8-12 weeks after passing the kidney stone, and every 6-12 months after that.  Strain your urine every time you urinate, for as long as directed. Use the strainer that your health care provider recommends.  Do not throw out the kidney stone after passing it. Keep the stone so it can be tested by your health care provider. Testing the makeup of your kidney stone may help prevent you from getting kidney stones in the future.  Keep all follow-up visits as told by your health care provider. This is important. You may need follow-up X-rays or ultrasounds to make sure that your stone has passed. How is this prevented? To prevent another kidney stone:  Drink enough fluid to keep your urine pale yellow. This is the best way to prevent kidney stones.  Eat a healthy diet and follow recommendations from your health care provider about foods to avoid. You may be instructed to eat a low-protein diet. Recommendations vary depending on the type of kidney stone that you have.  Maintain a healthy weight. Where to find more information  [place] [facility]): www.kidney.[place] [facility]): www.urologyhealth.org Contact a health care provider if:  You have pain that gets worse or does not get better with medicine. Get help right away if:  You have a fever or chills.  You  develop severe pain.  You develop new abdominal pain.  You faint.  You are unable to urinate. Summary  Kidney stones are solid, rock-like deposits that form inside of the kidneys.  Kidney stones can cause nausea, vomiting, blood in the urine, abdominal pain, and the urge to urinate frequently.  Treatment for kidney stones depends on the size, location, and makeup of the stones. Kidney stones will often pass out of the body through urination.  Kidney stones can be prevented by drinking enough fluids, eating a healthy diet, and maintaining a healthy weight. This information is not intended to replace advice given to you by your health care provider. Make sure you discuss any questions you have with your health care provider. Document Revised: 05/04/2019 Document Reviewed: 05/04/2019 Elsevier Patient Education  [place].

## 2020-04-14 ENCOUNTER — Telehealth: Payer: Self-pay | Admitting: Family Medicine

## 2020-04-14 NOTE — Chronic Care Management (AMB) (Signed)
  Chronic Care Management   Note  04/14/2020 Name: Stewart Bruzek. MRN: [id number] DOB: 07-Jan-1951  Kaamil Reina. is a 69 y.o. year old male who is a primary care patient of Ria Bush, MD. I reached out to Toy Cookey. by phone today in response to a referral sent by Mr. Shaka Huneke Jr.'s PCP, Ria Bush, MD.   Mr. Sedgwick was given information about Chronic Care Management services today including:  1. CCM service includes personalized support from designated clinical staff supervised by his physician, including individualized plan of care and coordination with other care providers 2. 24/7 contact phone numbers for assistance for urgent and routine care needs. 3. Service will only be billed when office clinical staff spend 20 minutes or more in a month to coordinate care. 4. Only one practitioner may furnish and bill the service in a calendar month. 5. The patient may stop CCM services at any time (effective at the end of the month) by phone call to the office staff.   Patient agreed to services and verbal consent obtained.   Follow up plan:   Raynicia Dukes [employer] Scheduler

## 2020-04-17 ENCOUNTER — Encounter: Payer: Self-pay | Admitting: Family Medicine

## 2020-05-04 DIAGNOSIS — R69 Illness, unspecified: Secondary | ICD-10-CM | POA: Diagnosis not present

## 2020-05-12 ENCOUNTER — Telehealth: Payer: Medicare HMO

## 2020-06-12 DIAGNOSIS — R69 Illness, unspecified: Secondary | ICD-10-CM | POA: Diagnosis not present

## 2020-07-18 ENCOUNTER — Other Ambulatory Visit: Payer: Self-pay | Admitting: Family Medicine

## 2020-07-18 DIAGNOSIS — E039 Hypothyroidism, unspecified: Secondary | ICD-10-CM

## 2020-07-18 DIAGNOSIS — Z125 Encounter for screening for malignant neoplasm of prostate: Secondary | ICD-10-CM

## 2020-07-18 DIAGNOSIS — R7303 Prediabetes: Secondary | ICD-10-CM

## 2020-07-18 DIAGNOSIS — E785 Hyperlipidemia, unspecified: Secondary | ICD-10-CM

## 2020-07-18 DIAGNOSIS — E538 Deficiency of other specified B group vitamins: Secondary | ICD-10-CM

## 2020-07-19 ENCOUNTER — Other Ambulatory Visit: Payer: Self-pay

## 2020-07-19 ENCOUNTER — Other Ambulatory Visit (INDEPENDENT_AMBULATORY_CARE_PROVIDER_SITE_OTHER): Payer: Medicare HMO

## 2020-07-19 DIAGNOSIS — R7303 Prediabetes: Secondary | ICD-10-CM | POA: Diagnosis not present

## 2020-07-19 DIAGNOSIS — E785 Hyperlipidemia, unspecified: Secondary | ICD-10-CM | POA: Diagnosis not present

## 2020-07-19 DIAGNOSIS — E039 Hypothyroidism, unspecified: Secondary | ICD-10-CM

## 2020-07-19 DIAGNOSIS — E538 Deficiency of other specified B group vitamins: Secondary | ICD-10-CM | POA: Diagnosis not present

## 2020-07-19 DIAGNOSIS — Z125 Encounter for screening for malignant neoplasm of prostate: Secondary | ICD-10-CM | POA: Diagnosis not present

## 2020-07-19 LAB — COMPREHENSIVE METABOLIC PANEL WITH GFR
ALT: 12 U/L (ref 0–53)
AST: 15 U/L (ref 0–37)
Albumin: 4.1 g/dL (ref 3.5–5.2)
Alkaline Phosphatase: 74 U/L (ref 39–117)
BUN: 9 mg/dL (ref 6–23)
CO2: 27 meq/L (ref 19–32)
Calcium: 9.1 mg/dL (ref 8.4–10.5)
Chloride: 103 meq/L (ref 96–112)
Creatinine, Ser: 0.85 mg/dL (ref 0.40–1.50)
GFR: 88.88 mL/min
Glucose, Bld: 108 mg/dL — ABNORMAL HIGH (ref 70–99)
Potassium: 4.2 meq/L (ref 3.5–5.1)
Sodium: 137 meq/L (ref 135–145)
Total Bilirubin: 0.7 mg/dL (ref 0.2–1.2)
Total Protein: 6.8 g/dL (ref 6.0–8.3)

## 2020-07-19 LAB — VITAMIN B12: Vitamin B-12: 845 pg/mL (ref 211–911)

## 2020-07-19 LAB — HEMOGLOBIN A1C: Hgb A1c MFr Bld: 5.9 % (ref 4.6–6.5)

## 2020-07-19 LAB — LIPID PANEL
Cholesterol: 144 mg/dL (ref 0–200)
HDL: 64.1 mg/dL
LDL Cholesterol: 66 mg/dL (ref 0–99)
NonHDL: 80.38
Total CHOL/HDL Ratio: 2
Triglycerides: 70 mg/dL (ref 0.0–149.0)
VLDL: 14 mg/dL (ref 0.0–40.0)

## 2020-07-19 LAB — PSA, MEDICARE: PSA: 0.75 ng/mL (ref 0.10–4.00)

## 2020-07-19 LAB — TSH: TSH: 3.08 u[IU]/mL (ref 0.35–4.50)

## 2020-07-21 ENCOUNTER — Encounter: Payer: Self-pay | Admitting: Family Medicine

## 2020-07-21 ENCOUNTER — Ambulatory Visit: Payer: Medicare HMO

## 2020-07-21 ENCOUNTER — Other Ambulatory Visit: Payer: Self-pay

## 2020-07-21 ENCOUNTER — Ambulatory Visit (INDEPENDENT_AMBULATORY_CARE_PROVIDER_SITE_OTHER): Payer: Medicare HMO | Admitting: Family Medicine

## 2020-07-21 VITALS — BP 134/66 | HR 62 | Temp 97.9°F | Ht 67.5 in | Wt 170.1 lb

## 2020-07-21 DIAGNOSIS — I10 Essential (primary) hypertension: Secondary | ICD-10-CM

## 2020-07-21 DIAGNOSIS — Z7289 Other problems related to lifestyle: Secondary | ICD-10-CM

## 2020-07-21 DIAGNOSIS — N529 Male erectile dysfunction, unspecified: Secondary | ICD-10-CM

## 2020-07-21 DIAGNOSIS — Z7189 Other specified counseling: Secondary | ICD-10-CM

## 2020-07-21 DIAGNOSIS — E785 Hyperlipidemia, unspecified: Secondary | ICD-10-CM

## 2020-07-21 DIAGNOSIS — Z0000 Encounter for general adult medical examination without abnormal findings: Secondary | ICD-10-CM | POA: Diagnosis not present

## 2020-07-21 DIAGNOSIS — F1090 Alcohol use, unspecified, uncomplicated: Secondary | ICD-10-CM

## 2020-07-21 DIAGNOSIS — E039 Hypothyroidism, unspecified: Secondary | ICD-10-CM

## 2020-07-21 DIAGNOSIS — I6523 Occlusion and stenosis of bilateral carotid arteries: Secondary | ICD-10-CM

## 2020-07-21 DIAGNOSIS — E538 Deficiency of other specified B group vitamins: Secondary | ICD-10-CM

## 2020-07-21 DIAGNOSIS — N281 Cyst of kidney, acquired: Secondary | ICD-10-CM

## 2020-07-21 DIAGNOSIS — R7303 Prediabetes: Secondary | ICD-10-CM

## 2020-07-21 LAB — POC URINALSYSI DIPSTICK (AUTOMATED)
Bilirubin, UA: NEGATIVE
Blood, UA: NEGATIVE
Glucose, UA: NEGATIVE
Ketones, UA: NEGATIVE
Leukocytes, UA: NEGATIVE
Nitrite, UA: NEGATIVE
Protein, UA: NEGATIVE
Spec Grav, UA: 1.01
Urobilinogen, UA: 0.2 U/dL
pH, UA: 6

## 2020-07-21 MED ORDER — SILDENAFIL CITRATE 100 MG PO TABS: 50.0000 mg | ORAL_TABLET | Freq: Every day | ORAL | 3 refills | Status: DC | PRN

## 2020-07-21 NOTE — Assessment & Plan Note (Signed)

## 2020-07-21 NOTE — Patient Instructions (Addendum)
Urinalysis today.  Bring me copy of your living will.  If interested, check with pharmacy about new 2 shot shingles series (shingrix).  Continue working on backing down on alcohol.  You are doing well today Return as needed or in 1 year for wellness visit.   Health Maintenance After Age 71 After age 7, you are at a higher risk for certain long-term diseases and infections as well as injuries from falls. Falls are a major cause of broken bones and head injuries in people who are older than age 52. Getting regular preventive care can help to keep you healthy and well. Preventive care includes getting regular testing and making lifestyle changes as recommended by your health care provider. Talk with your health care provider about:  Which screenings and tests you should have. A screening is a test that checks for a disease when you have no symptoms.  A diet and exercise plan that is right for you. What should I know about screenings and tests to prevent falls? Screening and testing are the best ways to find a health problem early. Early diagnosis and treatment give you the best chance of managing medical conditions that are common after age 34. Certain conditions and lifestyle choices may make you more likely to have a fall. Your health care provider may recommend:  Regular vision checks. Poor vision and conditions such as cataracts can make you more likely to have a fall. If you wear glasses, make sure to get your prescription updated if your vision changes.  Medicine review. Work with your health care provider to regularly review all of the medicines you are taking, including over-the-counter medicines. Ask your health care provider about any side effects that may make you more likely to have a fall. Tell your health care provider if any medicines that you take make you feel dizzy or sleepy.  Osteoporosis screening. Osteoporosis is a condition that causes the bones to get weaker. This can make  the bones weak and cause them to break more easily.  Blood pressure screening. Blood pressure changes and medicines to control blood pressure can make you feel dizzy.  Strength and balance checks. Your health care provider may recommend certain tests to check your strength and balance while standing, walking, or changing positions.  Foot health exam. Foot pain and numbness, as well as not wearing proper footwear, can make you more likely to have a fall.  Depression screening. You may be more likely to have a fall if you have a fear of falling, feel emotionally low, or feel unable to do activities that you used to do.  Alcohol use screening. Using too much alcohol can affect your balance and may make you more likely to have a fall. What actions can I take to lower my risk of falls? General instructions  Talk with your health care provider about your risks for falling. Tell your health care provider if: ? You fall. Be sure to tell your health care provider about all falls, even ones that seem minor. ? You feel dizzy, sleepy, or off-balance.  Take over-the-counter and prescription medicines only as told by your health care provider. These include any supplements.  Eat a healthy diet and maintain a healthy weight. A healthy diet includes low-fat dairy products, low-fat (lean) meats, and fiber from whole grains, beans, and lots of fruits and vegetables. Home safety  Remove any tripping hazards, such as rugs, cords, and clutter.  Install safety equipment such as grab bars in bathrooms  and safety rails on stairs.  Keep rooms and walkways well-lit. Activity   Follow a regular exercise program to stay fit. This will help you maintain your balance. Ask your health care provider what types of exercise are appropriate for you.  If you need a cane or walker, use it as recommended by your health care provider.  Wear supportive shoes that have nonskid soles. Lifestyle  Do not drink alcohol if  your health care provider tells you not to drink.  If you drink alcohol, limit how much you have: ? 0-1 drink a day for women. ? 0-2 drinks a day for men.  Be aware of how much alcohol is in your drink. In the [place] one drink equals one typical bottle of beer (12 oz), one-half glass of wine (5 oz), or one shot of hard liquor (1 oz).  Do not use any products that contain nicotine or tobacco, such as cigarettes and e-cigarettes. If you need help quitting, ask your health care provider. Summary  Having a healthy lifestyle and getting preventive care can help to protect your health and wellness after age 75.  Screening and testing are the best way to find a health problem early and help you avoid having a fall. Early diagnosis and treatment give you the best chance for managing medical conditions that are more common for people who are older than age 6.  Falls are a major cause of broken bones and head injuries in people who are older than age 61. Take precautions to prevent a fall at home.  Work with your health care provider to learn what changes you can make to improve your health and wellness and to prevent falls. This information is not intended to replace advice given to you by your health care provider. Make sure you discuss any questions you have with your health care provider. Document Revised: 04/08/2019 Document Reviewed: 10/29/2017 Elsevier Patient Education  2020 [employer].

## 2020-07-21 NOTE — Progress Notes (Signed)
This visit was conducted in person.  BP (!) 134/66 (BP Location: Right Arm, Patient Position: Sitting, Cuff Size: Normal)   Pulse 62   Temp 97.9 F (36.6 C) (Temporal)   Ht 5' 7.5" (1.715 m)   Wt 170 lb 2 oz (77.2 kg)   SpO2 98%   BMI 26.25 kg/m    CC: CPE/AMW Subjective:    Patient ID: Toy Cookey., male    DOB: 05/28/1951, 69 y.o.   MRN: [id number]  HPI: Nina Hoar. is a 69 y.o. male presenting on 07/21/2020 for Medicare Wellness   Did not see health advisor this year.    Hearing Screening   125Hz  250Hz  500Hz  1000Hz  2000Hz  3000Hz  4000Hz  6000Hz  8000Hz   Right ear:           Left ear:           Comments: Wears bilateral hearing aids.  Wearing at today's visit.    Visual Acuity Screening   Right eye Left eye Both eyes  Without correction: 20/25 20/25 20/25   With correction:         Office Visit from 07/21/2020 in [place] at [facility] Total Score 0      Fall Risk  07/21/2020 07/19/2019 07/13/2018 05/21/2017 05/15/2016  Falls in the past year? 0 0 No No No  Injury with Fall? - - - - -  Risk for fall due to : - Medication side effect - - -  Follow up - Falls evaluation completed;Falls prevention discussed - - -    Would like plantar warts evaluated - wife pares down and uses OTC remedies about once a month.   Possible kidney stone 02/2020 - treated with flomax, NSAID with benefit.  After sex has noted small amt brown discoloration to semen. This seems to have cleared up 2 weeks ago. No dysuria or discomfort with this.   Occasional aspirin for alcohol related am headache.   Preventative: COLONOSCOPY Date: 01/2016 TA, mod diverticulosis, f/u 5 yrs Henrene Pastor) Prostate cancer screening -PSA reassuring - requests spacing out DRE Lung cancer screening - quit 20 yrs ago Flu shot - yearly Tdap- 03/2014 COVID vaccine - completed Pfizer series 01/2020 prevnar 2016, pneumovax2017 Shingles shot - discussed. Declines.  Advanced directive discussion  - has at home. HCPOA is wife. Asked to bring [place] copy.  Seat belt use discussed Sunscreen use and skin screen discussed Ex smoker - quit 2000  Alcohol - heavy drinker on weekends (6-8) - 24 beers on weekend, no alcohol on M-W.  Dentist Q6 mo Eye exam has not seen  Bowel - no diarrhea/constipation Bladder - no incontinence   Married, lives with wife, new cat  1 child  Occ: [employer], retired, now cares for a few lawns.  Activity: outdoor work  Diet: good water, vegetables daily     Relevant past medical, surgical, family and social history reviewed and updated as indicated. Interim medical history since our last visit reviewed. Allergies and medications reviewed and updated. Outpatient Medications Prior to Visit  Medication Sig Dispense Refill  . amLODipine (NORVASC) 5 MG tablet TAKE 1 TABLET BY MOUTH EVERY DAY 90 tablet 1  . aspirin 325 MG tablet Take 325 mg by mouth once a week.     Marland Kitchen atorvastatin (LIPITOR) 40 MG tablet TAKE 1 TABLET BY MOUTH EVERY DAY 90 tablet 3  . levothyroxine (SYNTHROID) 75 MCG tablet TAKE 1 TABLET BY MOUTH EVERY DAY 90 tablet 3  . vitamin B-12 (V-R  VITAMIN B-12) 500 MCG tablet Take 1 tablet (500 mcg total) by mouth daily.    . sildenafil (VIAGRA) 100 MG tablet TAKE 0.5-1 TABLETS (50-100 MG TOTAL) BY MOUTH DAILY AS NEEDED FOR ERECTILE DYSFUNCTION. 10 tablet 3  . Acetaminophen-Codeine (TYLENOL/CODEINE #3) 300-30 MG tablet Take 1 tablet by mouth 3 (three) times daily as needed for pain. 15 tablet 0  . tamsulosin (FLOMAX) 0.4 MG CAPS capsule Take 1 capsule (0.4 mg total) by mouth daily. 10 capsule 0   No facility-administered medications prior to visit.     Per HPI unless specifically indicated in ROS section below Review of Systems  Constitutional: Negative for activity change, appetite change, chills, fatigue, fever and unexpected weight change.  HENT: Negative for hearing loss.   Eyes: Negative for visual disturbance.  Respiratory:  Negative for cough, chest tightness, shortness of breath and wheezing.   Cardiovascular: Negative for chest pain, palpitations and leg swelling.  Gastrointestinal: Negative for abdominal distention, abdominal pain, blood in stool, constipation, diarrhea, nausea and vomiting.  Genitourinary: Negative for difficulty urinating and hematuria.  Musculoskeletal: Negative for arthralgias, myalgias and neck pain.  Skin: Negative for rash.  Neurological: Negative for dizziness, seizures, syncope and headaches.  Hematological: Negative for adenopathy. Bruises/bleeds easily.  Psychiatric/Behavioral: Negative for dysphoric mood. The patient is not nervous/anxious.    Objective:  BP (!) 134/66 (BP Location: Right Arm, Patient Position: Sitting, Cuff Size: Normal)   Pulse 62   Temp 97.9 F (36.6 C) (Temporal)   Ht 5' 7.5" (1.715 m)   Wt 170 lb 2 oz (77.2 kg)   SpO2 98%   BMI 26.25 kg/m   Wt Readings from Last 3 Encounters:  07/21/20 170 lb 2 oz (77.2 kg)  03/20/20 174 lb 4 oz (79 kg)  07/19/19 171 lb 12.8 oz (77.9 kg)      Physical Exam Vitals and nursing note reviewed.  Constitutional:      General: He is not in acute distress.    Appearance: Normal appearance. He is well-developed. He is not ill-appearing.  HENT:     Head: Normocephalic and atraumatic.     Right Ear: Hearing, tympanic membrane, ear canal and external ear normal.     Left Ear: Hearing, tympanic membrane, ear canal and external ear normal.  Eyes:     General: No scleral icterus.    Extraocular Movements: Extraocular movements intact.     Conjunctiva/sclera: Conjunctivae normal.     Pupils: Pupils are equal, round, and reactive to light.  Neck:     Thyroid: No thyroid mass, thyromegaly or thyroid tenderness.     Vascular: Carotid bruit (R sided) present.  Cardiovascular:     Rate and Rhythm: Normal rate and regular rhythm.     Pulses: Normal pulses.          Radial pulses are 2+ on the right side and 2+ on the left  side.     Heart sounds: Normal heart sounds. No murmur heard.   Pulmonary:     Effort: Pulmonary effort is normal. No respiratory distress.     Breath sounds: Normal breath sounds. No wheezing, rhonchi or rales.  Abdominal:     General: Abdomen is flat. Bowel sounds are normal. There is no distension.     Palpations: Abdomen is soft. There is no mass.     Tenderness: There is no abdominal tenderness. There is no guarding or rebound.     Hernia: No hernia is present.  Genitourinary:    Prostate:  Normal. Not enlarged (15gm), not tender and no nodules present.     Rectum: Normal. No mass, tenderness, anal fissure, external hemorrhoid or internal hemorrhoid. Normal anal tone.  Musculoskeletal:        General: Normal range of motion.     Cervical back: Normal range of motion and neck supple.     Right lower leg: No edema.     Left lower leg: No edema.  Lymphadenopathy:     Cervical: No cervical adenopathy.  Skin:    General: Skin is warm and dry.     Findings: No rash.     Comments: Few plantar warts to R sole, mild  Neurological:     General: No focal deficit present.     Mental Status: He is alert and oriented to person, place, and time.     Comments:  CN grossly intact, station and gait intact Recall 3/3 Calculation 3/5 DLORW  Psychiatric:        Mood and Affect: Mood normal.        Behavior: Behavior normal.        Thought Content: Thought content normal.        Judgment: Judgment normal.       Results for orders placed or performed in visit on 07/21/20  POCT Urinalysis Dipstick (Automated)  Result Value Ref Range   Color, UA yellow    Clarity, UA clear    Glucose, UA Negative Negative   Bilirubin, UA negative    Ketones, UA negative    Spec Grav, UA 1.010 1.010 - 1.025   Blood, UA negative    pH, UA 6.0 5.0 - 8.0   Protein, UA Negative Negative   Urobilinogen, UA 0.2 0.2 or 1.0 E.U./dL   Nitrite, UA negative    Leukocytes, UA Negative Negative   Depression  screen [facility] 2/9 07/21/2020 07/19/2019 07/13/2018 05/21/2017 05/15/2016  Decreased Interest 0 0 0 0 0  Down, Depressed, Hopeless 0 0 0 0 0  PHQ - 2 Score 0 0 0 0 0  Altered sleeping - 0 0 - -  Tired, decreased energy - 0 0 - -  Change in appetite - 0 0 - -  Feeling bad or failure about yourself  - 0 0 - -  Trouble concentrating - 0 0 - -  Moving slowly or fidgety/restless - 0 0 - -  Suicidal thoughts - 0 0 - -  PHQ-9 Score - 0 0 - -  Difficult doing work/chores - - Not difficult at all - -   Assessment & Plan:  This visit occurred during the SARS-CoV-2 public health emergency.  Safety protocols were in place, including screening questions prior to the visit, additional usage of staff PPE, and extensive cleaning of exam room while observing appropriate contact time as indicated for disinfecting solutions.   Problem List Items Addressed This Visit    Vitamin B12 deficiency    Levels normal on replacement.       Renal cyst, acquired, right    Last imaged 2016. Consider updating. UA today normal.       Prediabetes    Stable, chronic, encouraged limiting added sugars.       Medicare annual wellness visit, subsequent    I have personally reviewed the Medicare Annual Wellness questionnaire and have noted 1. The patient's medical and social history 2. Their use of alcohol, tobacco or illicit drugs 3. Their current medications and supplements 4. The patient's functional ability including ADL's, fall risks, home  safety risks and hearing or visual impairment. Cognitive function has been assessed and addressed as indicated.  5. Diet and physical activity 6. Evidence for depression or mood disorders The patients weight, height, BMI have been recorded in the chart. I have made referrals, counseling and provided education to the patient based on review of the above and I have provided the pt with a written personalized care plan for preventive services. Provider list updated.. See scanned  questionairre as needed for further documentation. Reviewed preventative protocols and updated unless pt declined.       Hypothyroidism    Chronic, stable on current regimen.       HLD (hyperlipidemia)    Chronic, stable on lipitor- continue The 10-year ASCVD risk score Mikey Bussing DC Jr., et al., 2013) is: 17.3%   Values used to calculate the score:     Age: 45 years     Sex: Male     Is Non-Hispanic African American: No     Diabetic: No     Tobacco smoker: No     Systolic Blood Pressure: 092 mmHg     Is BP treated: Yes     HDL Cholesterol: 64.1 mg/dL     Total Cholesterol: 144 mg/dL       Relevant Medications   sildenafil (VIAGRA) 100 MG tablet   Health maintenance examination    Preventative protocols reviewed and updated unless pt declined. Discussed healthy diet and lifestyle.       Relevant Orders   POCT Urinalysis Dipstick (Automated) (Completed)   Habitual alcohol use    Heavy weekend drinker - 24 beers per week. Encouraged cutting down for long term liver health.       Essential hypertension    Chronic, stable on amlodipine - continue.      Relevant Medications   sildenafil (VIAGRA) 100 MG tablet   Erectile dysfunction    Continue viagra - effective.       Carotid stenosis, asymptomatic    Chronic R carotid bruit - latest [place] 2018 with stable 1-39% BICA stenosis.  Continue statin. Consider updating.       Relevant Medications   sildenafil (VIAGRA) 100 MG tablet   Advanced care planning/counseling discussion    Advanced directive discussion - has at home. HCPOA is wife. Asked to bring [place] copy.           Meds ordered this encounter  Medications  . sildenafil (VIAGRA) 100 MG tablet    Sig: Take 0.5-1 tablets (50-100 mg total) by mouth daily as needed for erectile dysfunction.    Dispense:  10 tablet    Refill:  3   Orders Placed This Encounter  Procedures  . POCT Urinalysis Dipstick (Automated)    Patient instructions: Urinalysis today.  Bring  me copy of your living will.  If interested, check with pharmacy about new 2 shot shingles series (shingrix).  Continue working on backing down on alcohol.  You are doing well today Return as needed or in 1 year for wellness visit.   Follow up plan: Return in about 1 year (around 07/21/2021) for annual exam, prior fasting for blood work, medicare wellness visit.  Ria Bush, MD

## 2020-07-21 NOTE — Assessment & Plan Note (Signed)
Preventative protocols reviewed and updated unless pt declined. Discussed healthy diet and lifestyle.  

## 2020-07-22 NOTE — Assessment & Plan Note (Signed)
Heavy weekend drinker - 24 beers per week. Encouraged cutting down for long term liver health.

## 2020-07-22 NOTE — Assessment & Plan Note (Signed)
Levels normal on replacement.  

## 2020-07-22 NOTE — Assessment & Plan Note (Signed)
Advanced directive discussion - has at home. HCPOA is wife. Asked to bring [place] copy.

## 2020-07-22 NOTE — Assessment & Plan Note (Signed)
Chronic, stable on amlodipine - continue.  

## 2020-07-22 NOTE — Assessment & Plan Note (Addendum)
Last imaged 2016. Consider updating. UA today normal.

## 2020-07-22 NOTE — Assessment & Plan Note (Signed)
Chronic R carotid bruit - latest [place] 2018 with stable 1-39% BICA stenosis.  Continue statin. Consider updating.

## 2020-07-22 NOTE — Assessment & Plan Note (Signed)
Continue viagra - effective.

## 2020-07-22 NOTE — Assessment & Plan Note (Signed)
Chronic, stable on lipitor- continue The 10-year ASCVD risk score Mikey Bussing DC Brooke Bonito., et al., 2013) is: 17.3%   Values used to calculate the score:     Age: 69 years     Sex: Male     Is Non-Hispanic African American: No     Diabetic: No     Tobacco smoker: No     Systolic Blood Pressure: 177 mmHg     Is BP treated: Yes     HDL Cholesterol: 64.1 mg/dL     Total Cholesterol: 144 mg/dL

## 2020-07-22 NOTE — Assessment & Plan Note (Signed)
Stable, chronic, encouraged limiting added sugars.

## 2020-07-22 NOTE — Assessment & Plan Note (Signed)
Chronic, stable on current regimen.  

## 2020-07-24 ENCOUNTER — Ambulatory Visit: Payer: Medicare HMO

## 2020-08-23 ENCOUNTER — Other Ambulatory Visit: Payer: Self-pay | Admitting: Family Medicine

## 2020-09-29 DIAGNOSIS — R69 Illness, unspecified: Secondary | ICD-10-CM | POA: Diagnosis not present

## 2020-11-25 IMAGING — DX DG ABDOMEN 1V
2 series · 2 of 2 positions shown · non-contrast
Comparison: CT abdomen and pelvis March 04, 2014

CLINICAL DATA: Right flank pain

EXAM:
ABDOMEN - 1 VIEW

[abdomen kub (1 of 2)]
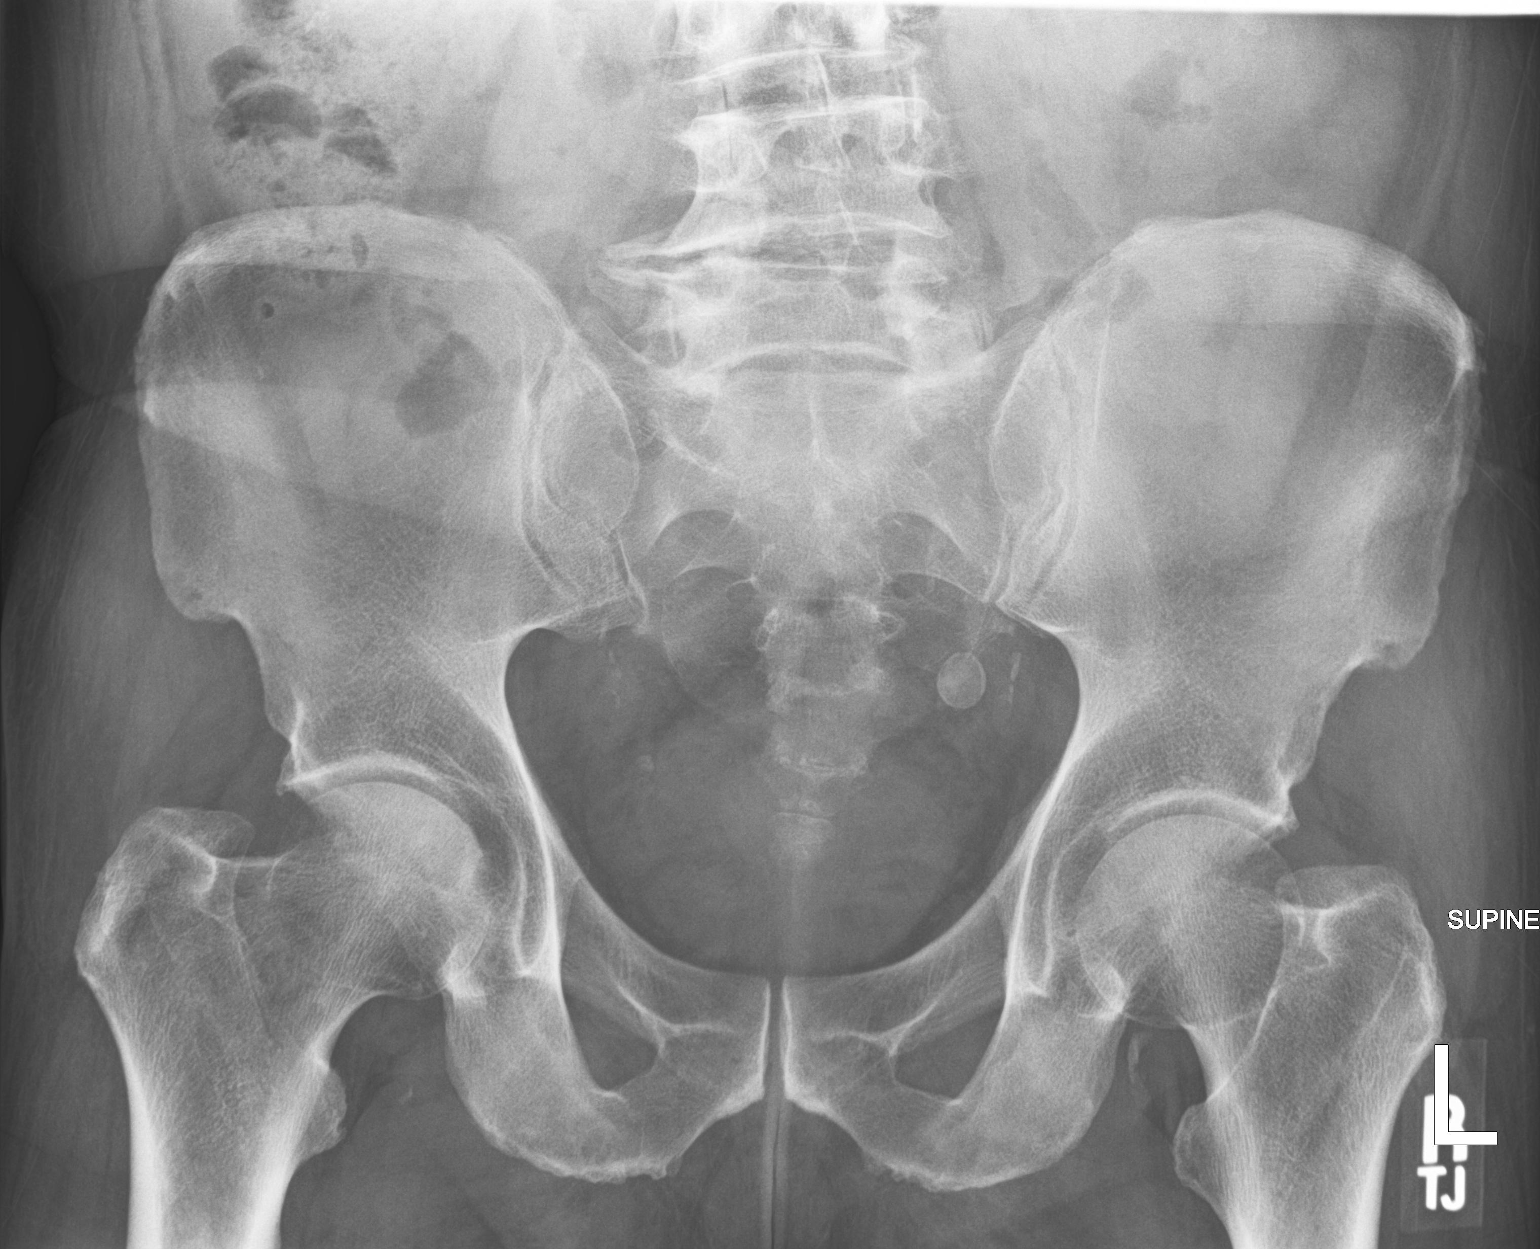

[abdomen kub (2 of 2)]
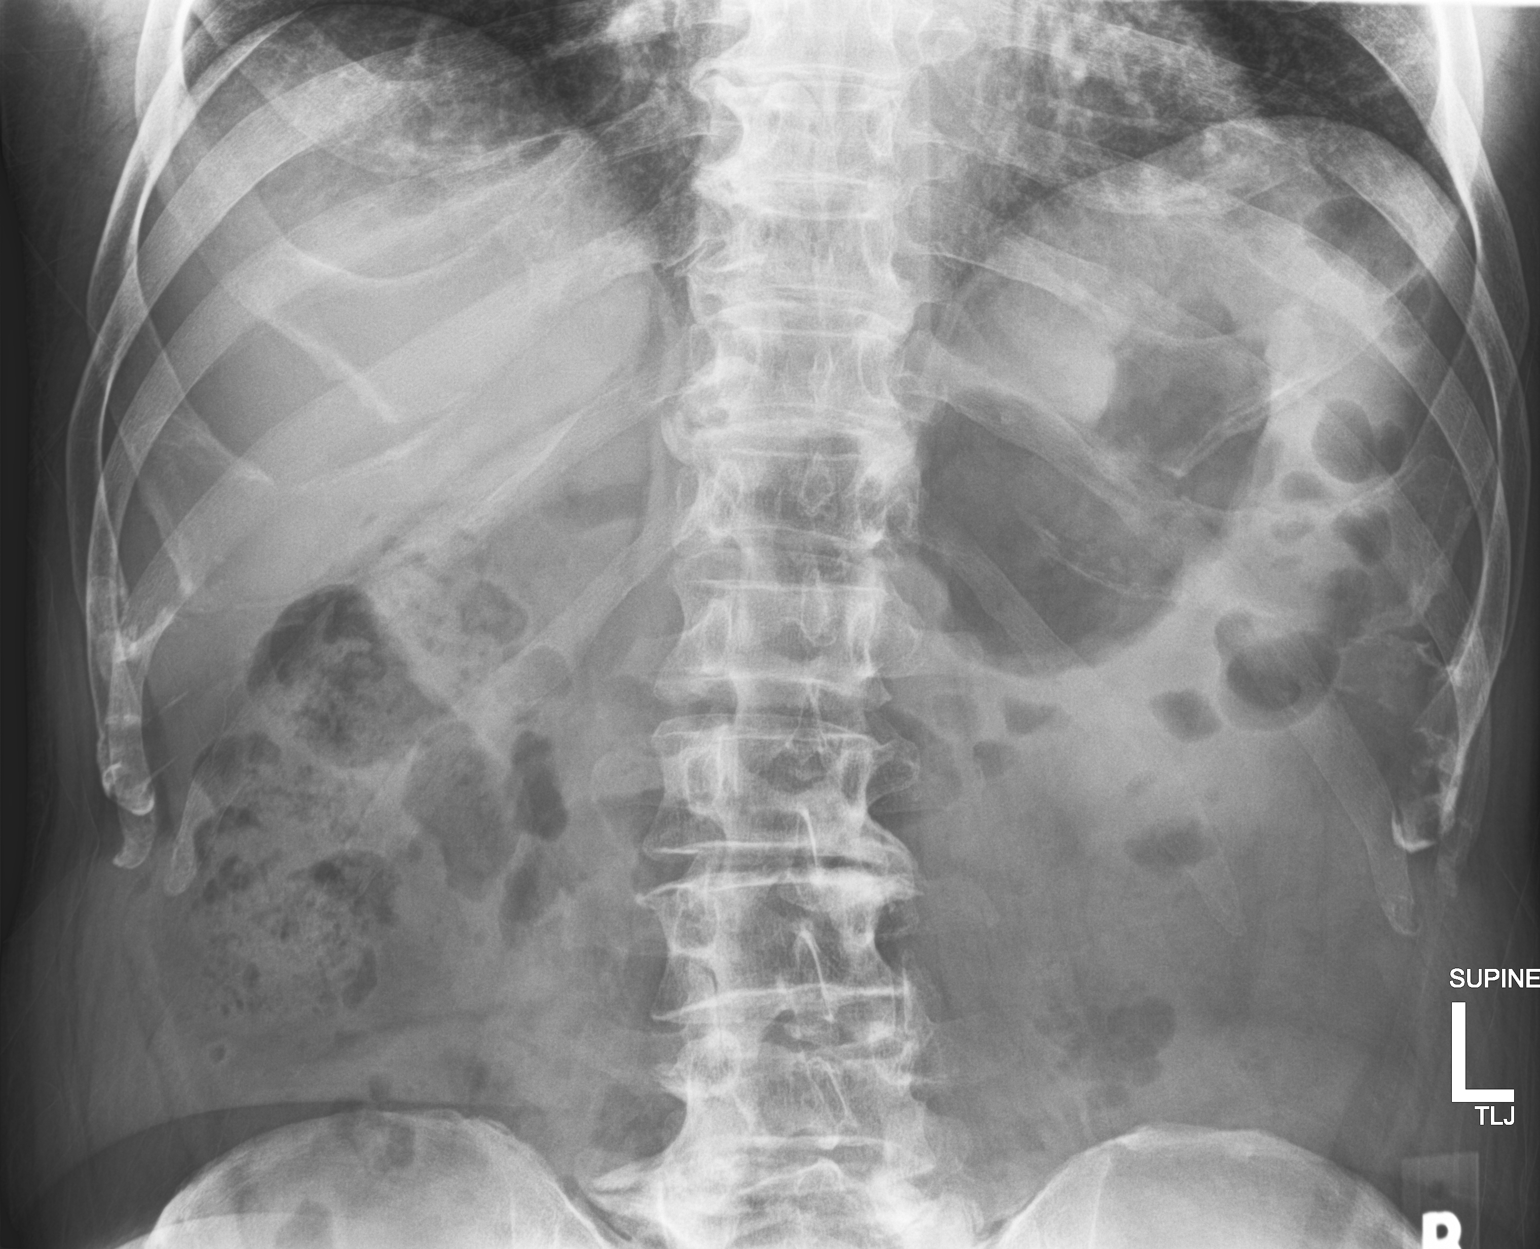

[2 of 2 positions shown; findings below may reference images not displayed]

FINDINGS: There are suspected phleboliths in the pelvis. There are foci of
arterial vascular calcification in the pelvis. No other abnormal
calcifications are evident. There is moderate stool in the colon.
There is no bowel dilatation or air-fluid level to suggest bowel
obstruction. No free air. Visualized lung bases are clear. There is
degenerative change in the lumbar spine.
IMPRESSION: Suspect phleboliths in the pelvis. Arterial vascular calcification
pelvis is well. No other abnormal calcifications.

No bowel obstruction or free air.  Visualized lung bases clear.

## 2021-07-22 ENCOUNTER — Other Ambulatory Visit: Payer: Self-pay | Admitting: Family Medicine

## 2021-07-22 DIAGNOSIS — Z125 Encounter for screening for malignant neoplasm of prostate: Secondary | ICD-10-CM

## 2021-07-22 DIAGNOSIS — R7303 Prediabetes: Secondary | ICD-10-CM

## 2021-07-22 DIAGNOSIS — E538 Deficiency of other specified B group vitamins: Secondary | ICD-10-CM

## 2021-07-22 DIAGNOSIS — E039 Hypothyroidism, unspecified: Secondary | ICD-10-CM

## 2021-07-22 DIAGNOSIS — I10 Essential (primary) hypertension: Secondary | ICD-10-CM

## 2021-07-22 DIAGNOSIS — E785 Hyperlipidemia, unspecified: Secondary | ICD-10-CM

## 2021-07-22 DIAGNOSIS — G6289 Other specified polyneuropathies: Secondary | ICD-10-CM

## 2021-07-24 ENCOUNTER — Other Ambulatory Visit: Payer: Medicare HMO

## 2021-07-27 ENCOUNTER — Encounter: Payer: Medicare HMO | Admitting: Family Medicine

## 2021-07-31 ENCOUNTER — Encounter: Payer: Self-pay | Admitting: Internal Medicine

## 2021-08-23 ENCOUNTER — Other Ambulatory Visit: Payer: Self-pay | Admitting: Family Medicine

## 2021-08-29 ENCOUNTER — Other Ambulatory Visit: Payer: Self-pay | Admitting: Family Medicine

## 2021-10-09 ENCOUNTER — Other Ambulatory Visit: Payer: Medicare HMO

## 2021-10-15 ENCOUNTER — Ambulatory Visit: Payer: Medicare HMO

## 2021-10-16 ENCOUNTER — Ambulatory Visit (INDEPENDENT_AMBULATORY_CARE_PROVIDER_SITE_OTHER): Payer: Medicare HMO | Admitting: Family Medicine

## 2021-10-16 ENCOUNTER — Other Ambulatory Visit: Payer: Self-pay

## 2021-10-16 ENCOUNTER — Encounter: Payer: Self-pay | Admitting: Family Medicine

## 2021-10-16 VITALS — BP 150/76 | HR 62 | Temp 97.9°F | Ht 68.0 in | Wt 168.1 lb

## 2021-10-16 DIAGNOSIS — Z7189 Other specified counseling: Secondary | ICD-10-CM

## 2021-10-16 DIAGNOSIS — Z125 Encounter for screening for malignant neoplasm of prostate: Secondary | ICD-10-CM

## 2021-10-16 DIAGNOSIS — E538 Deficiency of other specified B group vitamins: Secondary | ICD-10-CM

## 2021-10-16 DIAGNOSIS — Z0000 Encounter for general adult medical examination without abnormal findings: Secondary | ICD-10-CM

## 2021-10-16 DIAGNOSIS — G6289 Other specified polyneuropathies: Secondary | ICD-10-CM

## 2021-10-16 DIAGNOSIS — I6523 Occlusion and stenosis of bilateral carotid arteries: Secondary | ICD-10-CM

## 2021-10-16 DIAGNOSIS — E785 Hyperlipidemia, unspecified: Secondary | ICD-10-CM | POA: Diagnosis not present

## 2021-10-16 DIAGNOSIS — I10 Essential (primary) hypertension: Secondary | ICD-10-CM

## 2021-10-16 DIAGNOSIS — E039 Hypothyroidism, unspecified: Secondary | ICD-10-CM

## 2021-10-16 DIAGNOSIS — Z87891 Personal history of nicotine dependence: Secondary | ICD-10-CM

## 2021-10-16 DIAGNOSIS — R7303 Prediabetes: Secondary | ICD-10-CM

## 2021-10-16 DIAGNOSIS — F1090 Alcohol use, unspecified, uncomplicated: Secondary | ICD-10-CM

## 2021-10-16 LAB — COMPREHENSIVE METABOLIC PANEL WITH GFR
ALT: 14 U/L (ref 0–53)
AST: 19 U/L (ref 0–37)
Albumin: 4.5 g/dL (ref 3.5–5.2)
Alkaline Phosphatase: 66 U/L (ref 39–117)
BUN: 10 mg/dL (ref 6–23)
CO2: 28 meq/L (ref 19–32)
Calcium: 9.4 mg/dL (ref 8.4–10.5)
Chloride: 102 meq/L (ref 96–112)
Creatinine, Ser: 0.83 mg/dL (ref 0.40–1.50)
GFR: 87.66 mL/min
Glucose, Bld: 119 mg/dL — ABNORMAL HIGH (ref 70–99)
Potassium: 4.8 meq/L (ref 3.5–5.1)
Sodium: 137 meq/L (ref 135–145)
Total Bilirubin: 1 mg/dL (ref 0.2–1.2)
Total Protein: 7.1 g/dL (ref 6.0–8.3)

## 2021-10-16 LAB — MICROALBUMIN / CREATININE URINE RATIO
Creatinine,U: 30.1 mg/dL
Microalb Creat Ratio: 2.3 mg/g (ref 0.0–30.0)
Microalb, Ur: <0.7 mg/dL (ref 0.0–1.9)

## 2021-10-16 LAB — PSA: PSA: 0.65 ng/mL (ref 0.10–4.00)

## 2021-10-16 LAB — LIPID PANEL
Cholesterol: 147 mg/dL (ref 0–200)
HDL: 65.1 mg/dL
LDL Cholesterol: 66 mg/dL (ref 0–99)
NonHDL: 81.83
Total CHOL/HDL Ratio: 2
Triglycerides: 81 mg/dL (ref 0.0–149.0)
VLDL: 16.2 mg/dL (ref 0.0–40.0)

## 2021-10-16 LAB — TSH: TSH: 2.59 u[IU]/mL (ref 0.35–5.50)

## 2021-10-16 LAB — VITAMIN B12: Vitamin B-12: 638 pg/mL (ref 211–911)

## 2021-10-16 LAB — HEMOGLOBIN A1C: Hgb A1c MFr Bld: 5.8 % (ref 4.6–6.5)

## 2021-10-16 MED ORDER — ATORVASTATIN CALCIUM 40 MG PO TABS: 40.0000 mg | ORAL_TABLET | Freq: Every day | ORAL | 3 refills | Status: DC

## 2021-10-16 MED ORDER — AMLODIPINE BESYLATE 5 MG PO TABS: 5.0000 mg | ORAL_TABLET | Freq: Every day | ORAL | 3 refills | Status: DC

## 2021-10-16 MED ORDER — CYANOCOBALAMIN 1000 MCG PO TABS: 1000.0000 ug | ORAL_TABLET | ORAL | Status: DC

## 2021-10-16 MED ORDER — LEVOTHYROXINE SODIUM 75 MCG PO TABS: 75.0000 ug | ORAL_TABLET | Freq: Every day | ORAL | 3 refills | Status: DC

## 2021-10-16 NOTE — Assessment & Plan Note (Signed)
Remains abstinent 

## 2021-10-16 NOTE — Assessment & Plan Note (Signed)

## 2021-10-16 NOTE — Assessment & Plan Note (Signed)
Discussed risks of regular use on liver.

## 2021-10-16 NOTE — Assessment & Plan Note (Signed)
Chronic, stable on atorvastatin - continue. Update FLP.  The 10-year ASCVD risk score (Arnett DK, et al., 2019) is: 23.8%   Values used to calculate the score:     Age: 70 years     Sex: Male     Is Non-Hispanic African American: No     Diabetic: No     Tobacco smoker: No     Systolic Blood Pressure: 034 mmHg     Is BP treated: Yes     HDL Cholesterol: 64.1 mg/dL     Total Cholesterol: 144 mg/dL

## 2021-10-16 NOTE — Progress Notes (Signed)
Patient ID: Henry Baldwin., male    DOB: 10/11/1951, 70 y.o.   MRN: [id number]  This visit was conducted in person.  BP (!) 148/68   Pulse 62   Temp 97.9 F (36.6 C) (Temporal)   Ht 5\' 8"  (1.727 m)   Wt 168 lb 2 oz (76.3 kg)   SpO2 97%   BMI 25.56 kg/m    CC: AMW Subjective:   HPI: Henry Baldwin. is a 70 y.o. male presenting on 10/16/2021 for Medicare Wellness   Did not see health advisor.   Vision Screening   Right eye Left eye Both eyes  Without correction 20/30 20/40 20/30   With correction     Hearing Screening - Comments:: Wears bilateral hearing aids.  Wearing at today's OV.   [place] Office Visit from 10/16/2021 in [place] at [facility]  PHQ-2 Total Score 0       Fall Risk  10/16/2021 07/21/2020 07/19/2019 07/13/2018 05/21/2017  Falls in the past year? 0 0 0 No No  Injury with Fall? - - - - -  Risk for fall due to : - - Medication side effect - -  Follow up - - Falls evaluation completed;Falls prevention discussed - -    Wife not doing well recently.   BP - occasionally checked at home - 120/80s. Continues taking amlodipine 5mg  at night, occasional missed doses. Rec switch to day to better remember.   Infrequent aspirin 325mg  use usually after alcohol intake.   Hands fall asleep easily - L>R, ongoing for years, worse with vibration of leaf blower.    Preventative: COLONOSCOPY Date: 01/2016 TA, mod diverticulosis, f/u 5 yrs Henrene Pastor). Pending rpt scheduling - wants to wait a few months.  Prostate cancer screening - yearly PSA. Nocturia x1, occasional dribble.  Lung cancer screening - quit 20 yrs ago  Flu shot - yearly [place] 01/2020, booster 11/2020, bivalent 08/2021 Tdap - 03/2014 Prevnar-13 2016, pneumovax 2017 Shingles shot - discussed. Declines.  Advanced directive discussion - has at home. HCPOA is wife. Asked to bring [place] copy.  Seat belt use discussed Sunscreen use discussed. No changing moles on skin.  Ex  smoker - quit 2000  Alcohol - heavy drinker on weekends (5-7) - 24 beers on weekend, no alcohol on M-W.  Dentist Q6 mo  Eye exam has not seen - discussed this  Bowel - no diarrhea/constipation Bladder - no incontinence   Married, lives with wife 1 child  Occ: [employer], retired, now cares for a few lawns.  Activity: outdoor work  Diet: good water, some low cal gatorade, vegetables daily      Relevant past medical, surgical, family and social history reviewed and updated as indicated. Interim medical history since our last visit reviewed. Allergies and medications reviewed and updated. Outpatient Medications Prior to Visit  Medication Sig Dispense Refill   aspirin 325 MG tablet Take 325 mg by mouth once a week.      sildenafil (VIAGRA) 100 MG tablet TAKE 0.5-1 TABLETS (50-100 MG TOTAL) BY MOUTH DAILY AS NEEDED FOR ERECTILE DYSFUNCTION. 10 tablet 2   amLODipine (NORVASC) 5 MG tablet TAKE 1 TABLET BY MOUTH EVERY DAY 90 tablet 0   atorvastatin (LIPITOR) 40 MG tablet TAKE 1 TABLET BY MOUTH EVERY DAY 90 tablet 0   levothyroxine (SYNTHROID) 75 MCG tablet TAKE 1 TABLET BY MOUTH EVERY DAY 90 tablet 0   vitamin B-12 (V-R VITAMIN B-12) 500 MCG tablet Take 1  tablet (500 mcg total) by mouth daily.     No facility-administered medications prior to visit.     Per HPI unless specifically indicated in ROS section below Review of Systems  Constitutional:  Negative for activity change, appetite change, chills, fatigue, fever and unexpected weight change.  HENT:  Negative for hearing loss.   Eyes:  Negative for visual disturbance.  Respiratory:  Negative for cough, chest tightness, shortness of breath and wheezing.   Cardiovascular:  Negative for chest pain, palpitations and leg swelling.  Gastrointestinal:  Negative for abdominal distention, abdominal pain, blood in stool, constipation, diarrhea, nausea and vomiting.  Genitourinary:  Negative for difficulty urinating and  hematuria.  Musculoskeletal:  Negative for arthralgias, myalgias and neck pain.  Skin:  Negative for rash.  Neurological:  Negative for dizziness, seizures, syncope and headaches.  Hematological:  Negative for adenopathy. Does not bruise/bleed easily.  Psychiatric/Behavioral:  Negative for dysphoric mood. The patient is not nervous/anxious.    Objective:  BP (!) 148/68   Pulse 62   Temp 97.9 F (36.6 C) (Temporal)   Ht 5\' 8"  (1.727 m)   Wt 168 lb 2 oz (76.3 kg)   SpO2 97%   BMI 25.56 kg/m   Wt Readings from Last 3 Encounters:  10/16/21 168 lb 2 oz (76.3 kg)  07/21/20 170 lb 2 oz (77.2 kg)  03/20/20 174 lb 4 oz (79 kg)      Physical Exam Vitals and nursing note reviewed.  Constitutional:      General: He is not in acute distress.    Appearance: Normal appearance. He is well-developed. He is not ill-appearing.  HENT:     Head: Normocephalic and atraumatic.     Right Ear: Hearing, tympanic membrane, ear canal and external ear normal.     Left Ear: Hearing, tympanic membrane, ear canal and external ear normal.     Ears:     Comments: Wears hearing aides Eyes:     General: No scleral icterus.    Extraocular Movements: Extraocular movements intact.     Conjunctiva/sclera: Conjunctivae normal.     Pupils: Pupils are equal, round, and reactive to light.  Neck:     Thyroid: No thyroid mass or thyromegaly.     Vascular: Carotid bruit (L sided) present.  Cardiovascular:     Rate and Rhythm: Normal rate and regular rhythm.     Pulses: Normal pulses.          Radial pulses are 2+ on the right side and 2+ on the left side.     Heart sounds: Normal heart sounds. No murmur heard. Pulmonary:     Effort: Pulmonary effort is normal. No respiratory distress.     Breath sounds: Normal breath sounds. No wheezing, rhonchi or rales.  Abdominal:     General: Bowel sounds are normal. There is no distension.     Palpations: Abdomen is soft. There is no mass.     Tenderness: There is no  abdominal tenderness. There is no guarding or rebound.     Hernia: No hernia is present.  Musculoskeletal:        General: Normal range of motion.     Cervical back: Normal range of motion and neck supple.     Right lower leg: No edema.     Left lower leg: No edema.  Lymphadenopathy:     Cervical: No cervical adenopathy.  Skin:    General: Skin is warm and dry.     Findings: No  rash.  Neurological:     General: No focal deficit present.     Mental Status: He is alert and oriented to person, place, and time.     Comments:  Recall 3/3 Calculation 5/5 DLROW  Psychiatric:        Mood and Affect: Mood normal.        Behavior: Behavior normal.        Thought Content: Thought content normal.        Judgment: Judgment normal.      Results for orders placed or performed in visit on 07/21/20  POCT Urinalysis Dipstick (Automated)  Result Value Ref Range   Color, UA yellow    Clarity, UA clear    Glucose, UA Negative Negative   Bilirubin, UA negative    Ketones, UA negative    Spec Grav, UA 1.010 1.010 - 1.025   Blood, UA negative    pH, UA 6.0 5.0 - 8.0   Protein, UA Negative Negative   Urobilinogen, UA 0.2 0.2 or 1.0 E.U./dL   Nitrite, UA negative    Leukocytes, UA Negative Negative    Assessment & Plan:  This visit occurred during the SARS-CoV-2 public health emergency.  Safety protocols were in place, including screening questions prior to the visit, additional usage of staff PPE, and extensive cleaning of exam room while observing appropriate contact time as indicated for disinfecting solutions.   Problem List Items Addressed This Visit     Medicare annual wellness visit, subsequent - Primary (Chronic)    I have personally reviewed the Medicare Annual Wellness questionnaire and have noted 1. The patient's medical and social history 2. Their use of alcohol, tobacco or illicit drugs 3. Their current medications and supplements 4. The patient's functional ability including  ADL's, fall risks, home safety risks and hearing or visual impairment. Cognitive function has been assessed and addressed as indicated.  5. Diet and physical activity 6. Evidence for depression or mood disorders The patients weight, height, BMI have been recorded in the chart. I have made referrals, counseling and provided education to the patient based on review of the above and I have provided the pt with a written personalized care plan for preventive services. Provider list updated.. See scanned questionairre as needed for further documentation. Reviewed preventative protocols and updated unless pt declined.       Advanced care planning/counseling discussion (Chronic)    Advanced directive discussion - has at home. HCPOA is wife. Asked to bring [place] copy.       Health maintenance examination (Chronic)    Preventative protocols reviewed and updated unless pt declined. Discussed healthy diet and lifestyle.       Hypothyroidism    Chronic. Update levels on regular replacement.       Relevant Medications   levothyroxine (SYNTHROID) 75 MCG tablet   HLD (hyperlipidemia)    Chronic, stable on atorvastatin - continue. Update FLP.  The 10-year ASCVD risk score (Arnett DK, et al., 2019) is: 23.8%   Values used to calculate the score:     Age: 25 years     Sex: Male     Is Non-Hispanic African American: No     Diabetic: No     Tobacco smoker: No     Systolic Blood Pressure: 696 mmHg     Is BP treated: Yes     HDL Cholesterol: 64.1 mg/dL     Total Cholesterol: 144 mg/dL       Relevant Medications   amLODipine (  NORVASC) 5 MG tablet   atorvastatin (LIPITOR) 40 MG tablet   Ex-smoker    Remains abstinent.       Prediabetes    Update A1c.       Habitual alcohol use    Discussed risks of regular use on liver.       Carotid stenosis, asymptomatic    Update carotid US given R bruit heard .      Relevant Medications   amLODipine (NORVASC) 5 MG tablet   atorvastatin (LIPITOR)  40 MG tablet   Other Relevant Orders   VAS US CAROTID   Vitamin B12 deficiency    Update levels.       Peripheral neuropathy    Longstanding h/o this - check B1 levels.       Essential hypertension    Chronic, mildly elevated today however home readings well controlled. Will continue to monitor with intermittent home BP readings.       Relevant Medications   amLODipine (NORVASC) 5 MG tablet   atorvastatin (LIPITOR) 40 MG tablet   Other Visit Diagnoses     Special screening for malignant neoplasm of prostate            Meds ordered this encounter  Medications   vitamin B-12 1000 MCG tablet    Sig: Take 1 tablet (1,000 mcg total) by mouth every other day.   amLODipine (NORVASC) 5 MG tablet    Sig: Take 1 tablet (5 mg total) by mouth daily.    Dispense:  90 tablet    Refill:  3   atorvastatin (LIPITOR) 40 MG tablet    Sig: Take 1 tablet (40 mg total) by mouth daily.    Dispense:  90 tablet    Refill:  3   levothyroxine (SYNTHROID) 75 MCG tablet    Sig: Take 1 tablet (75 mcg total) by mouth daily.    Dispense:  90 tablet    Refill:  3   No orders of the defined types were placed in this encounter.    Patient instructions: Labs today  Try to cut down on alcohol some Call to schedule eye exam at your convenience.  Bring [place] a copy of your advanced directive/living will We will order carotid ultrasound to check left carotid artery. We will call you to schedule.  Return as needed or in 1 year for next physical/wellness visit  Follow up plan: Return in about 1 year (around 10/16/2022) for annual exam, prior fasting for blood work, medicare wellness visit.  Ria Bush, MD

## 2021-10-16 NOTE — Patient Instructions (Addendum)
Labs today  Try to cut down on alcohol some Call to schedule eye exam at your convenience.  Bring [place] a copy of your advanced directive/living will We will order carotid ultrasound to check left carotid artery. We will call you to schedule.  Return as needed or in 1 year for next physical/wellness visit.   Health Maintenance After Age 72 After age 32, you are at a higher risk for certain long-term diseases and infections as well as injuries from falls. Falls are a major cause of broken bones and head injuries in people who are older than age 1. Getting regular preventive care can help to keep you healthy and well. Preventive care includes getting regular testing and making lifestyle changes as recommended by your health care provider. Talk with your health care provider about: Which screenings and tests you should have. A screening is a test that checks for a disease when you have no symptoms. A diet and exercise plan that is right for you. What should I know about screenings and tests to prevent falls? Screening and testing are the best ways to find a health problem early. Early diagnosis and treatment give you the best chance of managing medical conditions that are common after age 61. Certain conditions and lifestyle choices may make you more likely to have a fall. Your health care provider may recommend: Regular vision checks. Poor vision and conditions such as cataracts can make you more likely to have a fall. If you wear glasses, make sure to get your prescription updated if your vision changes. Medicine review. Work with your health care provider to regularly review all of the medicines you are taking, including over-the-counter medicines. Ask your health care provider about any side effects that may make you more likely to have a fall. Tell your health care provider if any medicines that you take make you feel dizzy or sleepy. Osteoporosis screening. Osteoporosis is a condition that causes the  bones to get weaker. This can make the bones weak and cause them to break more easily. Blood pressure screening. Blood pressure changes and medicines to control blood pressure can make you feel dizzy. Strength and balance checks. Your health care provider may recommend certain tests to check your strength and balance while standing, walking, or changing positions. Foot health exam. Foot pain and numbness, as well as not wearing proper footwear, can make you more likely to have a fall. Depression screening. You may be more likely to have a fall if you have a fear of falling, feel emotionally low, or feel unable to do activities that you used to do. Alcohol use screening. Using too much alcohol can affect your balance and may make you more likely to have a fall. What actions can I take to lower my risk of falls? General instructions Talk with your health care provider about your risks for falling. Tell your health care provider if: You fall. Be sure to tell your health care provider about all falls, even ones that seem minor. You feel dizzy, sleepy, or off-balance. Take over-the-counter and prescription medicines only as told by your health care provider. These include any supplements. Eat a healthy diet and maintain a healthy weight. A healthy diet includes low-fat dairy products, low-fat (lean) meats, and fiber from whole grains, beans, and lots of fruits and vegetables. Home safety Remove any tripping hazards, such as rugs, cords, and clutter. Install safety equipment such as grab bars in bathrooms and safety rails on stairs. Keep rooms and  walkways well-lit. Activity  Follow a regular exercise program to stay fit. This will help you maintain your balance. Ask your health care provider what types of exercise are appropriate for you. If you need a cane or walker, use it as recommended by your health care provider. Wear supportive shoes that have nonskid soles. Lifestyle Do not drink alcohol if  your health care provider tells you not to drink. If you drink alcohol, limit how much you have: 0-1 drink a day for women. 0-2 drinks a day for men. Be aware of how much alcohol is in your drink. In the [place] one drink equals one typical bottle of beer (12 oz), one-half glass of wine (5 oz), or one shot of hard liquor (1 oz). Do not use any products that contain nicotine or tobacco, such as cigarettes and e-cigarettes. If you need help quitting, ask your health care provider. Summary Having a healthy lifestyle and getting preventive care can help to protect your health and wellness after age 43. Screening and testing are the best way to find a health problem early and help you avoid having a fall. Early diagnosis and treatment give you the best chance for managing medical conditions that are more common for people who are older than age 76. Falls are a major cause of broken bones and head injuries in people who are older than age 25. Take precautions to prevent a fall at home. Work with your health care provider to learn what changes you can make to improve your health and wellness and to prevent falls. This information is not intended to replace advice given to you by your health care provider. Make sure you discuss any questions you have with your health care provider. Document Revised: 02/23/2021 Document Reviewed: 12/01/2020 Elsevier Patient Education  2022 [employer].

## 2021-10-16 NOTE — Assessment & Plan Note (Signed)
Update A1c ?

## 2021-10-16 NOTE — Assessment & Plan Note (Signed)
Update carotid US given R bruit heard .

## 2021-10-16 NOTE — Assessment & Plan Note (Signed)
Update levels. 

## 2021-10-16 NOTE — Assessment & Plan Note (Signed)
Preventative protocols reviewed and updated unless pt declined. Discussed healthy diet and lifestyle.  

## 2021-10-16 NOTE — Assessment & Plan Note (Signed)
Chronic, mildly elevated today however home readings well controlled. Will continue to monitor with intermittent home BP readings.

## 2021-10-16 NOTE — Assessment & Plan Note (Signed)
Longstanding h/o this - check B1 levels.

## 2021-10-16 NOTE — Assessment & Plan Note (Signed)
Chronic. Update levels on regular replacement.

## 2021-10-16 NOTE — Assessment & Plan Note (Signed)
Advanced directive discussion - has at home. HCPOA is wife. Asked to bring [place] copy.

## 2021-10-17 ENCOUNTER — Ambulatory Visit (INDEPENDENT_AMBULATORY_CARE_PROVIDER_SITE_OTHER): Payer: Medicare HMO

## 2021-10-17 DIAGNOSIS — I6523 Occlusion and stenosis of bilateral carotid arteries: Secondary | ICD-10-CM

## 2021-10-19 LAB — VITAMIN B1: Vitamin B1 (Thiamine): 7 nmol/L — ABNORMAL LOW (ref 8–30)

## 2021-10-22 ENCOUNTER — Other Ambulatory Visit: Payer: Self-pay | Admitting: Family Medicine

## 2021-10-22 DIAGNOSIS — E519 Thiamine deficiency, unspecified: Secondary | ICD-10-CM | POA: Insufficient documentation

## 2021-10-22 DIAGNOSIS — G6289 Other specified polyneuropathies: Secondary | ICD-10-CM

## 2021-10-22 MED ORDER — VITAMIN B-1 50 MG PO TABS: 50.0000 mg | ORAL_TABLET | Freq: Every day | ORAL | Status: DC

## 2021-12-25 ENCOUNTER — Telehealth: Payer: Self-pay | Admitting: Family Medicine

## 2021-12-25 NOTE — Telephone Encounter (Signed)
Pt wife dropped off paperwork  Type of forms received:declare of a natural death  Routed RM:BOBO  Paperwork received by : terrill   Individual made aware of 3-5 business day turn around (Y/N): y Form completed and patient made aware of charges(Y/N): y  Faxed to :   Form location:  dr g. box

## 2022-05-07 DIAGNOSIS — E039 Hypothyroidism, unspecified: Secondary | ICD-10-CM | POA: Diagnosis not present

## 2022-05-07 DIAGNOSIS — I10 Essential (primary) hypertension: Secondary | ICD-10-CM | POA: Diagnosis not present

## 2022-05-07 DIAGNOSIS — E785 Hyperlipidemia, unspecified: Secondary | ICD-10-CM | POA: Diagnosis not present

## 2022-05-07 DIAGNOSIS — Z809 Family history of malignant neoplasm, unspecified: Secondary | ICD-10-CM | POA: Diagnosis not present

## 2022-05-07 DIAGNOSIS — M1990 Unspecified osteoarthritis, unspecified site: Secondary | ICD-10-CM | POA: Diagnosis not present

## 2022-05-07 DIAGNOSIS — Z791 Long term (current) use of non-steroidal anti-inflammatories (NSAID): Secondary | ICD-10-CM | POA: Diagnosis not present

## 2022-05-07 DIAGNOSIS — Z008 Encounter for other general examination: Secondary | ICD-10-CM | POA: Diagnosis not present

## 2022-05-07 DIAGNOSIS — Z841 Family history of disorders of kidney and ureter: Secondary | ICD-10-CM | POA: Diagnosis not present

## 2022-07-08 ENCOUNTER — Encounter: Payer: Self-pay | Admitting: Internal Medicine

## 2022-07-30 ENCOUNTER — Ambulatory Visit (AMBULATORY_SURGERY_CENTER): Payer: Self-pay | Admitting: *Deleted

## 2022-07-30 VITALS — Ht 68.0 in | Wt 172.4 lb

## 2022-07-30 DIAGNOSIS — Z860100 Personal history of colon polyps, unspecified: Secondary | ICD-10-CM

## 2022-07-30 MED ORDER — NA SULFATE-K SULFATE-MG SULF 17.5-3.13-1.6 GM/177ML PO SOLN: 2.0000 | Freq: Once | ORAL | 0 refills | Status: AC

## 2022-07-30 NOTE — Progress Notes (Signed)

## 2022-07-31 ENCOUNTER — Telehealth: Payer: Self-pay | Admitting: Internal Medicine

## 2022-07-31 NOTE — Telephone Encounter (Signed)
Spoke with pt's wife, informed unable to use mail order pharmacy due to [place] of receiving prep too late for scheduled colonoscopy. Offered Singlecare card discount coupon for Suprep rx already in hand, and offered new [facility] rx as alternative. She discussed with pt and advised she would print Singlecare card for [place]. She will call if Singlecare card is not accepted at their [facility].

## 2022-07-31 NOTE — Telephone Encounter (Signed)
Inbound call from [employer] stating that patients suprep was sent to the pharmacy and he is going to be charged 100 dollars for it and it needs to be send to him in mail to his house.  Requesting a call be given to patient to discuss which mail order to use. Please advise.

## 2022-08-19 ENCOUNTER — Encounter: Payer: Self-pay | Admitting: Internal Medicine

## 2022-08-27 ENCOUNTER — Encounter: Payer: Self-pay | Admitting: Internal Medicine

## 2022-08-27 ENCOUNTER — Ambulatory Visit (AMBULATORY_SURGERY_CENTER): Payer: Medicare HMO | Admitting: Internal Medicine

## 2022-08-27 VITALS — BP 104/65 | HR 51 | Temp 98.0°F | Resp 21 | Ht 68.0 in | Wt 172.4 lb

## 2022-08-27 DIAGNOSIS — Z860100 Personal history of colon polyps, unspecified: Secondary | ICD-10-CM

## 2022-08-27 DIAGNOSIS — K648 Other hemorrhoids: Secondary | ICD-10-CM

## 2022-08-27 DIAGNOSIS — K5730 Diverticulosis of large intestine without perforation or abscess without bleeding: Secondary | ICD-10-CM | POA: Diagnosis not present

## 2022-08-27 DIAGNOSIS — E039 Hypothyroidism, unspecified: Secondary | ICD-10-CM | POA: Diagnosis not present

## 2022-08-27 DIAGNOSIS — Z800 Family history of malignant neoplasm of digestive organs: Secondary | ICD-10-CM | POA: Diagnosis not present

## 2022-08-27 DIAGNOSIS — Z09 Encounter for follow-up examination after completed treatment for conditions other than malignant neoplasm: Secondary | ICD-10-CM | POA: Diagnosis not present

## 2022-08-27 DIAGNOSIS — I10 Essential (primary) hypertension: Secondary | ICD-10-CM | POA: Diagnosis not present

## 2022-08-27 MED ORDER — SODIUM CHLORIDE 0.9 % IV SOLN: 500.0000 mL | Freq: Once | INTRAVENOUS | Status: DC

## 2022-08-27 NOTE — Patient Instructions (Signed)
Handouts on diverticulosis & hemorrhoids given to you today  Resume usual diet & medications      YOU HAD AN ENDOSCOPIC PROCEDURE TODAY AT [facility]:   Refer to the procedure report that was given to you for any specific questions about what was found during the examination.  If the procedure report does not answer your questions, please call your gastroenterologist to clarify.  If you requested that your care partner not be given the details of your procedure findings, then the procedure report has been included in a sealed envelope for you to review at your convenience later.  YOU SHOULD EXPECT: Some feelings of bloating in the abdomen. Passage of more gas than usual.  Walking can help get rid of the air that was put into your GI tract during the procedure and reduce the bloating. If you had a lower endoscopy (such as a colonoscopy or flexible sigmoidoscopy) you may notice spotting of blood in your stool or on the toilet paper. If you underwent a bowel prep for your procedure, you may not have a normal bowel movement for a few days.  Please Note:  You might notice some irritation and congestion in your nose or some drainage.  This is from the oxygen used during your procedure.  There is no need for concern and it should clear up in a day or so.  SYMPTOMS TO REPORT IMMEDIATELY:  Following lower endoscopy (colonoscopy or flexible sigmoidoscopy):  Excessive amounts of blood in the stool  Significant tenderness or worsening of abdominal pains  Swelling of the abdomen that is new, acute  Fever of 100F or higher  Following upper endoscopy (EGD)  Vomiting of blood or coffee ground material  New chest pain or pain under the shoulder blades  Painful or persistently difficult swallowing  New shortness of breath  Fever of 100F or higher  Black, tarry-looking stools  For urgent or emergent issues, a gastroenterologist can be reached at any hour by calling [phone]  [phone]. Do not use MyChart messaging for urgent concerns.    DIET:  We do recommend a small meal at first, but then you may proceed to your regular diet.  Drink plenty of fluids but you should avoid alcoholic beverages for 24 hours.  ACTIVITY:  You should plan to take it easy for the rest of today and you should NOT DRIVE or use heavy machinery until tomorrow (because of the sedation medicines used during the test).    FOLLOW UP: Our staff will call the number listed on your records the next business day following your procedure.  We will call around 7:15- 8:00 am to check on you and address any questions or concerns that you may have regarding the information given to you following your procedure. If we do not reach you, we will leave a message.  If you develop any symptoms (ie: fever, flu-like symptoms, shortness of breath, cough etc.) before then, please call [phone].  If you test positive for Covid 19 in the 2 weeks post procedure, please call and report this information to [place].    If any biopsies were taken you will be contacted by phone or by letter within the next 1-3 weeks.  Please call us at [phone] if you have not heard about the biopsies in 3 weeks.    SIGNATURES/CONFIDENTIALITY: You and/or your care partner have signed paperwork which will be entered into your electronic medical record.  These signatures attest to the fact that  that the information above on your After Visit Summary has been reviewed and is understood.  Full responsibility of the confidentiality of this discharge information lies with you and/or your care-partner.

## 2022-08-27 NOTE — Progress Notes (Signed)
HISTORY OF PRESENT ILLNESS:  Henry Mcaffee. is a 71 y.o. male with a family history of colon cancer and personal history of adenomatous polyps.  Presents today for surveillance colonoscopy.  Last examination 2017  REVIEW OF SYSTEMS:  All non-GI ROS negative except for  Past Medical History:  Diagnosis Date   Carotid stenosis, asymptomatic 2016   1-39% B, rpt 2 yrs   Cataract    Cyst of right kidney 2015   2cm, stable on rpt [place]   DJD (degenerative joint disease), lumbar    HLD (hyperlipidemia)    Borderline   HTN (hypertension)    Hypothyroid    Lyme disease 05/18/2013   Nephrolithiasis 02/2014    Past Surgical History:  Procedure Laterality Date   COLONOSCOPY  10/28/01   with polypectomy   COLONOSCOPY  10/19/2004   Divertics; no polyps   COLONOSCOPY  11/20/10   Normal-Dr. Henrene Pastor   COLONOSCOPY  01/2016   TA, mod diverticulosis, f/u 5 yrs Henrene Pastor)   FOREIGN BODY REMOVAL Left 2007   fifth finger    Social History Henry Baldwin.  reports that he quit smoking about 23 years ago. His smoking use included cigarettes. He has never used smokeless tobacco. He reports current alcohol use of about 32.0 standard drinks of alcohol per week. He reports that he does not use drugs.  family history includes Alcohol abuse in his brother; Cancer in his paternal uncle; Colon cancer (age of onset: 48) in his mother; Congestive Heart Failure in his mother; Heart disease in his brother; Hypertension in his father; Thyroid disease in his brother, father, and sister.  No Known Allergies     PHYSICAL EXAMINATION: Vital signs: BP 130/70   Pulse 65   Temp 98 F (36.7 C)   Ht '5\' 8"'$  (1.727 m)   Wt 172 lb 6.4 oz (78.2 kg)   SpO2 98%   BMI 26.21 kg/m  General: Well-developed, well-nourished, no acute distress HEENT: Sclerae are anicteric, conjunctiva pink. Oral mucosa intact Lungs: Clear Heart: Regular Abdomen: soft, nontender, nondistended, no obvious ascites, no peritoneal signs,  normal bowel sounds. No organomegaly. Extremities: No edema Psychiatric: alert and oriented x3. Cooperative      ASSESSMENT:  History of adenomatous polyps and family history of colon cancer   PLAN:   Surveillance colonoscopy

## 2022-08-27 NOTE — Op Note (Signed)
[facility] Patient Name: Henry Baldwin Procedure Date: 08/27/2022 8:46 AM MRN: [id number] Endoscopist: Docia Chuck. Henrene Pastor , MD Age: 71 Referring MD:  Date of Birth: 10-14-1951 Gender: Male Account #: [id number] Procedure:                Colonoscopy Indications:              High risk colon cancer surveillance: Personal                            history of non-advanced adenoma. Also, family                            history of colon cancer. Previous examinations                            2002, 2005, 2011, 2017 Medicines:                Monitored Anesthesia Care Procedure:                Pre-Anesthesia Assessment:                           - Prior to the procedure, a History and Physical                            was performed, and patient medications and                            allergies were reviewed. The patient's tolerance of                            previous anesthesia was also reviewed. The risks                            and benefits of the procedure and the sedation                            options and risks were discussed with the patient.                            All questions were answered, and informed consent                            was obtained. Prior Anticoagulants: The patient has                            taken no previous anticoagulant or antiplatelet                            agents. ASA Grade Assessment: II - A patient with                            mild systemic disease. After reviewing the risks  and benefits, the patient was deemed in                            satisfactory condition to undergo the procedure.                           After obtaining informed consent, the colonoscope                            was passed under direct vision. Throughout the                            procedure, the patient's blood pressure, pulse, and                            oxygen saturations were monitored continuously. The                             Colonoscope was introduced through the anus and                            advanced to the the cecum, identified by                            appendiceal orifice and ileocecal valve. The                            ileocecal valve, appendiceal orifice, and rectum                            were photographed. The quality of the bowel                            preparation was excellent. The colonoscopy was                            performed without difficulty. The patient tolerated                            the procedure well. The bowel preparation used was                            SUPREP via split dose instruction. Scope In: 8:52:48 AM Scope Out: 9:06:27 AM Scope Withdrawal Time: 0 hours 10 minutes 8 seconds  Total Procedure Duration: 0 hours 13 minutes 39 seconds  Findings:                 Multiple diverticula were found in the sigmoid                            colon.                           Internal hemorrhoids were found during retroflexion.  The exam was otherwise without abnormality on                            direct and retroflexion views. Complications:            No immediate complications. Estimated blood loss:                            None. Estimated Blood Loss:     Estimated blood loss: none. Impression:               - Diverticulosis in the sigmoid colon.                           - Internal hemorrhoids.                           - The examination was otherwise normal on direct                            and retroflexion views.                           - No specimens collected. Recommendation:           - Repeat colonoscopy is not recommended for                            surveillance.                           - Patient has a contact number available for                            emergencies. The signs and symptoms of potential                            delayed complications were discussed with the                             patient. Return to normal activities tomorrow.                            Written discharge instructions were provided to the                            patient.                           - Resume previous diet.                           - Continue present medications. Docia Chuck. Henrene Pastor, MD 08/27/2022 9:11:38 AM This report has been signed electronically.

## 2022-08-27 NOTE — Progress Notes (Signed)
Sedate, gd SR, tolerated procedure well, VSS, report to RN 

## 2022-08-28 ENCOUNTER — Telehealth: Payer: Self-pay

## 2022-08-28 NOTE — Telephone Encounter (Signed)
  Follow up Call-     08/27/2022    7:37 AM  Call back number  Post procedure Call Back phone  # [phone]  Permission to leave phone message Yes     Patient questions:  Do you have a fever, pain , or abdominal swelling? No. Pain Score  0 *  Have you tolerated food without any problems? Yes.    Have you been able to return to your normal activities? Yes.    Do you have any questions about your discharge instructions: Diet   No. Medications  No. Follow up visit  No.  Do you have questions or concerns about your Care? No.  Actions: * If pain score is 4 or above: No action needed, pain <4.

## 2022-10-18 ENCOUNTER — Encounter: Payer: Self-pay | Admitting: Family Medicine

## 2022-10-18 ENCOUNTER — Ambulatory Visit (INDEPENDENT_AMBULATORY_CARE_PROVIDER_SITE_OTHER): Payer: Medicare HMO | Admitting: Family Medicine

## 2022-10-18 VITALS — BP 150/72 | HR 63 | Temp 97.3°F | Ht 67.5 in | Wt 171.1 lb

## 2022-10-18 DIAGNOSIS — E519 Thiamine deficiency, unspecified: Secondary | ICD-10-CM

## 2022-10-18 DIAGNOSIS — Z23 Encounter for immunization: Secondary | ICD-10-CM | POA: Diagnosis not present

## 2022-10-18 DIAGNOSIS — E538 Deficiency of other specified B group vitamins: Secondary | ICD-10-CM | POA: Diagnosis not present

## 2022-10-18 DIAGNOSIS — R7303 Prediabetes: Secondary | ICD-10-CM | POA: Diagnosis not present

## 2022-10-18 DIAGNOSIS — E039 Hypothyroidism, unspecified: Secondary | ICD-10-CM

## 2022-10-18 DIAGNOSIS — I10 Essential (primary) hypertension: Secondary | ICD-10-CM

## 2022-10-18 DIAGNOSIS — Z125 Encounter for screening for malignant neoplasm of prostate: Secondary | ICD-10-CM

## 2022-10-18 DIAGNOSIS — G6289 Other specified polyneuropathies: Secondary | ICD-10-CM | POA: Diagnosis not present

## 2022-10-18 DIAGNOSIS — E785 Hyperlipidemia, unspecified: Secondary | ICD-10-CM

## 2022-10-18 DIAGNOSIS — N529 Male erectile dysfunction, unspecified: Secondary | ICD-10-CM

## 2022-10-18 DIAGNOSIS — Z87891 Personal history of nicotine dependence: Secondary | ICD-10-CM

## 2022-10-18 DIAGNOSIS — F1090 Alcohol use, unspecified, uncomplicated: Secondary | ICD-10-CM

## 2022-10-18 DIAGNOSIS — Z0000 Encounter for general adult medical examination without abnormal findings: Secondary | ICD-10-CM

## 2022-10-18 LAB — COMPREHENSIVE METABOLIC PANEL WITH GFR
ALT: 14 U/L (ref 0–53)
AST: 15 U/L (ref 0–37)
Albumin: 4.5 g/dL (ref 3.5–5.2)
Alkaline Phosphatase: 71 U/L (ref 39–117)
BUN: 12 mg/dL (ref 6–23)
CO2: 27 meq/L (ref 19–32)
Calcium: 9.5 mg/dL (ref 8.4–10.5)
Chloride: 102 meq/L (ref 96–112)
Creatinine, Ser: 0.94 mg/dL (ref 0.40–1.50)
GFR: 80.62 mL/min
Glucose, Bld: 109 mg/dL — ABNORMAL HIGH (ref 70–99)
Potassium: 4.9 meq/L (ref 3.5–5.1)
Sodium: 138 meq/L (ref 135–145)
Total Bilirubin: 0.6 mg/dL (ref 0.2–1.2)
Total Protein: 7.1 g/dL (ref 6.0–8.3)

## 2022-10-18 LAB — VITAMIN B12: Vitamin B-12: 634 pg/mL (ref 211–911)

## 2022-10-18 LAB — CBC WITH DIFFERENTIAL/PLATELET
Basophils Absolute: 0 10*3/uL (ref 0.0–0.1)
Basophils Relative: 0.4 % (ref 0.0–3.0)
Eosinophils Absolute: 0.1 10*3/uL (ref 0.0–0.7)
Eosinophils Relative: 1.1 % (ref 0.0–5.0)
HCT: 44.3 % (ref 39.0–52.0)
Hemoglobin: 14.7 g/dL (ref 13.0–17.0)
Lymphocytes Relative: 23.5 % (ref 12.0–46.0)
Lymphs Abs: 1.3 10*3/uL (ref 0.7–4.0)
MCHC: 33.1 g/dL (ref 30.0–36.0)
MCV: 98.6 fl (ref 78.0–100.0)
Monocytes Absolute: 0.4 10*3/uL (ref 0.1–1.0)
Monocytes Relative: 7.6 % (ref 3.0–12.0)
Neutro Abs: 3.8 10*3/uL (ref 1.4–7.7)
Neutrophils Relative %: 67.4 % (ref 43.0–77.0)
Platelets: 269 10*3/uL (ref 150.0–400.0)
RBC: 4.5 Mil/uL (ref 4.22–5.81)
RDW: 13 % (ref 11.5–15.5)
WBC: 5.7 10*3/uL (ref 4.0–10.5)

## 2022-10-18 LAB — LIPID PANEL
Cholesterol: 152 mg/dL (ref 0–200)
HDL: 69 mg/dL
LDL Cholesterol: 75 mg/dL (ref 0–99)
NonHDL: 83.22
Total CHOL/HDL Ratio: 2
Triglycerides: 43 mg/dL (ref 0.0–149.0)
VLDL: 8.6 mg/dL (ref 0.0–40.0)

## 2022-10-18 LAB — TSH: TSH: 3.34 u[IU]/mL (ref 0.35–5.50)

## 2022-10-18 LAB — HEMOGLOBIN A1C: Hgb A1c MFr Bld: 6.1 % (ref 4.6–6.5)

## 2022-10-18 LAB — PSA: PSA: 0.67 ng/mL (ref 0.10–4.00)

## 2022-10-18 MED ORDER — AMLODIPINE BESYLATE 5 MG PO TABS: 5.0000 mg | ORAL_TABLET | Freq: Every day | ORAL | 3 refills | Status: DC

## 2022-10-18 MED ORDER — ATORVASTATIN CALCIUM 40 MG PO TABS: 40.0000 mg | ORAL_TABLET | Freq: Every day | ORAL | 3 refills | Status: DC

## 2022-10-18 MED ORDER — SILDENAFIL CITRATE 100 MG PO TABS: 50.0000 mg | ORAL_TABLET | Freq: Every day | ORAL | 6 refills | Status: DC | PRN

## 2022-10-18 MED ORDER — LEVOTHYROXINE SODIUM 75 MCG PO TABS: 75.0000 ug | ORAL_TABLET | Freq: Every day | ORAL | 3 refills | Status: DC

## 2022-10-18 NOTE — Assessment & Plan Note (Signed)
Update A1c ?

## 2022-10-18 NOTE — Assessment & Plan Note (Signed)
Chronic, elevated in office however home readings largely well controlled. Anticipate component of white coat hypertension. No med changes today.

## 2022-10-18 NOTE — Assessment & Plan Note (Signed)
Update levels on 58mg daily.

## 2022-10-18 NOTE — Assessment & Plan Note (Signed)
Chronic, continues atorvastatin. Update FLP. The 10-year ASCVD risk score (Arnett DK, et al., 2019) is: 26.3%   Values used to calculate the score:     Age: 71 years     Sex: Male     Is Non-Hispanic African American: No     Diabetic: No     Tobacco smoker: No     Systolic Blood Pressure: 964 mmHg     Is BP treated: Yes     HDL Cholesterol: 65.1 mg/dL     Total Cholesterol: 147 mg/dL

## 2022-10-18 NOTE — Assessment & Plan Note (Signed)
Encouraged decreased alcohol intake.

## 2022-10-18 NOTE — Progress Notes (Signed)
Patient ID: Henry Baldwin., male    DOB: 01/26/1951, 71 y.o.   MRN: [id number]  This visit was conducted in person.  BP (!) 150/72   Pulse 63   Temp (!) 97.3 F (36.3 C) (Temporal)   Ht 5' 7.5" (1.715 m)   Wt 171 lb 2 oz (77.6 kg)   SpO2 98%   BMI 26.41 kg/m   158/66 on repeat testing Home BP 818-299 systolic  CC: AMW Subjective:   HPI: Henry Baldwin. is a 71 y.o. male presenting on 10/18/2022 for Medicare Wellness   Did not see health advisor.   Vision Screening   Right eye Left eye Both eyes  Without correction '20/30 20/40 20/30 '$  With correction     Hearing Screening - Comments:: Wears bilateral hearing aids.  Wearing at today's OV.   [place] Office Visit from 10/16/2021 in [place] [facility] Total Score 0     Hasn't noticed difficulty. He wears hearing aides    10/18/2022    8:55 AM 10/16/2021    8:49 AM 07/21/2020    9:31 AM 07/19/2019    1:10 PM 07/13/2018    9:24 AM  Fall Risk   Falls in the past year? 0 0 0 0 No  Risk for fall due to :    Medication side effect   Follow up    Falls evaluation completed;Falls prevention discussed    Wife has not been feeling well.   Works outdoors frequently. He has found several ticks recently. Noted skin lesions to left scrotum ?cherry angiomas  Notes R heel pain for a few weeks - he regularly was crushing aluminum cans with that foot. He's stopped doing this and it is slowly getting better. ?plantar fasciitis. Also wants toenails checked. He's been using funginail.   Ongoing hand numbness - L>R, ongoing for years, worse with vibration of leaf blower. entire hand.   ED - ongoing trouble maintaining erection. He's been taking full viagra '100mg'$  with limited benefit. Most recently had trouble with ejaculation as well (last tried 1 month ago).    Preventative: COLONOSCOPY Date: 01/2016 TA, mod diverticulosis, f/u 5 yrs Henrene Pastor). Colonoscopy 07/2022 - mod diverticulosis, int hem, no f/u  needed Henrene Pastor) Prostate cancer screening - yearly PSA. Nocturia x1, occasional dribble.  Lung cancer screening - quit 20 yrs ago  Flu shot - yearly Doon 01/2020, booster 11/2020, bivalent 08/2021 Tdap - 03/2014 Prevnar-13 2016, pneumovax 2017 Shingles shot - discussed, to consider.  Advanced directive discussion - has at home. HCPOA is wife. Asked to bring [place] copy.  Seat belt use discussed Sunscreen use discussed. No changing moles on skin.  Ex smoker - quit 2000  Alcohol - heavy drinker on weekends (5-7) - 24 beers on weekend, no alcohol on M-W.  Dentist Q6 mo  Eye exam has not seen - discussed this  Bowel - no diarrhea/constipation  Bladder - no incontinence   Married, lives with wife 1 child  Occ: [employer], retired, now cares for a few lawns.  Activity: outdoor work  Diet: good water, some low cal gatorade, vegetables daily      Relevant past medical, surgical, family and social history reviewed and updated as indicated. Interim medical history since our last visit reviewed. Allergies and medications reviewed and updated. Outpatient Medications Prior to Visit  Medication Sig Dispense Refill   aspirin 325 MG tablet Take 325 mg by mouth once a week.  thiamine (VITAMIN B-1) 50 MG tablet Take 1 tablet (50 mg total) by mouth daily.     vitamin B-12 1000 MCG tablet Take 1 tablet (1,000 mcg total) by mouth every other day.     amLODipine (NORVASC) 5 MG tablet Take 1 tablet (5 mg total) by mouth daily. 90 tablet 3   atorvastatin (LIPITOR) 40 MG tablet Take 1 tablet (40 mg total) by mouth daily. 90 tablet 3   levothyroxine (SYNTHROID) 75 MCG tablet Take 1 tablet (75 mcg total) by mouth daily. 90 tablet 3   sildenafil (VIAGRA) 100 MG tablet TAKE 0.5-1 TABLETS (50-100 MG TOTAL) BY MOUTH DAILY AS NEEDED FOR ERECTILE DYSFUNCTION. 10 tablet 2   No facility-administered medications prior to visit.     Per HPI unless specifically indicated in ROS  section below Review of Systems  Constitutional:  Negative for activity change, appetite change, chills, fatigue, fever and unexpected weight change.  HENT:  Negative for hearing loss.   Eyes:  Negative for visual disturbance.  Respiratory:  Negative for cough, chest tightness, shortness of breath and wheezing.   Cardiovascular:  Negative for chest pain, palpitations and leg swelling.  Gastrointestinal:  Negative for abdominal distention, abdominal pain, blood in stool, constipation, diarrhea, nausea and vomiting.  Genitourinary:  Negative for difficulty urinating and hematuria.  Musculoskeletal:  Negative for arthralgias, myalgias and neck pain.  Skin:  Negative for rash.  Neurological:  Negative for dizziness, seizures, syncope and headaches.  Hematological:  Negative for adenopathy. Does not bruise/bleed easily.  Psychiatric/Behavioral:  Negative for dysphoric mood. The patient is not nervous/anxious.     Objective:  BP (!) 150/72   Pulse 63   Temp (!) 97.3 F (36.3 C) (Temporal)   Ht 5' 7.5" (1.715 m)   Wt 171 lb 2 oz (77.6 kg)   SpO2 98%   BMI 26.41 kg/m   Wt Readings from Last 3 Encounters:  10/18/22 171 lb 2 oz (77.6 kg)  08/27/22 172 lb 6.4 oz (78.2 kg)  07/30/22 172 lb 6.4 oz (78.2 kg)      Physical Exam Vitals and nursing note reviewed.  Constitutional:      General: He is not in acute distress.    Appearance: Normal appearance. He is well-developed. He is not ill-appearing.  HENT:     Head: Normocephalic and atraumatic.     Right Ear: Hearing, tympanic membrane, ear canal and external ear normal.     Left Ear: Hearing, tympanic membrane, ear canal and external ear normal.  Eyes:     General: No scleral icterus.    Extraocular Movements: Extraocular movements intact.     Conjunctiva/sclera: Conjunctivae normal.     Pupils: Pupils are equal, round, and reactive to light.  Neck:     Thyroid: No thyroid mass or thyromegaly.     Vascular: Carotid bruit (left  sided) present.  Cardiovascular:     Rate and Rhythm: Normal rate and regular rhythm.     Pulses: Normal pulses.          Radial pulses are 2+ on the right side and 2+ on the left side.     Heart sounds: Normal heart sounds. No murmur heard. Pulmonary:     Effort: Pulmonary effort is normal. No respiratory distress.     Breath sounds: Normal breath sounds. No wheezing, rhonchi or rales.  Abdominal:     General: Bowel sounds are normal. There is no distension.     Palpations: Abdomen is soft. There is  no mass.     Tenderness: There is no abdominal tenderness. There is no guarding or rebound.     Hernia: No hernia is present.  Genitourinary:    Comments: Exam consistent with tiny scrotal cherry angiomas on left x3 Musculoskeletal:        General: Normal range of motion.     Cervical back: Normal range of motion and neck supple.     Right lower leg: No edema.     Left lower leg: No edema.     Comments: 2+ DP bilaterally  Lymphadenopathy:     Cervical: No cervical adenopathy.  Skin:    General: Skin is warm and dry.     Findings: No rash.  Neurological:     General: No focal deficit present.     Mental Status: He is alert and oriented to person, place, and time.     Comments:  Recall 3/3 Calculation 5/5 DLROW  Psychiatric:        Mood and Affect: Mood normal.        Behavior: Behavior normal.        Thought Content: Thought content normal.        Judgment: Judgment normal.       Results for orders placed or performed in visit on 10/16/21  Vitamin B1  Result Value Ref Range   Vitamin B1 (Thiamine) 7 (L) 8 - 30 nmol/L  Vitamin B12  Result Value Ref Range   Vitamin B-12 638 211 - 911 pg/mL  Microalbumin / creatinine urine ratio  Result Value Ref Range   Microalb, Ur <0.7 0.0 - 1.9 mg/dL   Creatinine,U 30.1 mg/dL   Microalb Creat Ratio 2.3 0.0 - 30.0 mg/g  PSA  Result Value Ref Range   PSA 0.65 0.10 - 4.00 ng/mL  Hemoglobin A1c  Result Value Ref Range   Hgb A1c MFr  Bld 5.8 4.6 - 6.5 %  TSH  Result Value Ref Range   TSH 2.59 0.35 - 5.50 uIU/mL  Comprehensive metabolic panel  Result Value Ref Range   Sodium 137 135 - 145 mEq/L   Potassium 4.8 3.5 - 5.1 mEq/L   Chloride 102 96 - 112 mEq/L   CO2 28 19 - 32 mEq/L   Glucose, Bld 119 (H) 70 - 99 mg/dL   BUN 10 6 - 23 mg/dL   Creatinine, Ser 0.83 0.40 - 1.50 mg/dL   Total Bilirubin 1.0 0.2 - 1.2 mg/dL   Alkaline Phosphatase 66 39 - 117 U/L   AST 19 0 - 37 U/L   ALT 14 0 - 53 U/L   Total Protein 7.1 6.0 - 8.3 g/dL   Albumin 4.5 3.5 - 5.2 g/dL   GFR 87.66 >60.00 mL/min   Calcium 9.4 8.4 - 10.5 mg/dL  Lipid panel  Result Value Ref Range   Cholesterol 147 0 - 200 mg/dL   Triglycerides 81.0 0.0 - 149.0 mg/dL   HDL 65.10 >39.00 mg/dL   VLDL 16.2 0.0 - 40.0 mg/dL   LDL Cholesterol 66 0 - 99 mg/dL   Total CHOL/HDL Ratio 2    NonHDL 81.83     Assessment & Plan:   Problem List Items Addressed This Visit     Medicare annual wellness visit, subsequent - Primary (Chronic)    I have personally reviewed the Medicare Annual Wellness questionnaire and have noted 1. The patient's medical and social history 2. Their use of alcohol, tobacco or illicit drugs 3. Their current medications and supplements 4.  The patient's functional ability including ADL's, fall risks, home safety risks and hearing or visual impairment. Cognitive function has been assessed and addressed as indicated.  5. Diet and physical activity 6. Evidence for depression or mood disorders The patients weight, height, BMI have been recorded in the chart. I have made referrals, counseling and provided education to the patient based on review of the above and I have provided the pt with a written personalized care plan for preventive services. Provider list updated.. See scanned questionairre as needed for further documentation. Reviewed preventative protocols and updated unless pt declined.       Health maintenance examination (Chronic)     Preventative protocols reviewed and updated unless pt declined. Discussed healthy diet and lifestyle.       Hypothyroidism    Update levels on 23mg daily.       Relevant Medications   levothyroxine (SYNTHROID) 75 MCG tablet   Other Relevant Orders   TSH   HLD (hyperlipidemia)    Chronic, continues atorvastatin. Update FLP. The 10-year ASCVD risk score (Arnett DK, et al., 2019) is: 26.3%   Values used to calculate the score:     Age: [age]years     Sex: Male     Is Non-Hispanic African American: No     Diabetic: No     Tobacco smoker: No     Systolic Blood Pressure: 1240mmHg     Is BP treated: Yes     HDL Cholesterol: 65.1 mg/dL     Total Cholesterol: 147 mg/dL       Relevant Medications   amLODipine (NORVASC) 5 MG tablet   atorvastatin (LIPITOR) 40 MG tablet   sildenafil (VIAGRA) 100 MG tablet   Other Relevant Orders   Lipid panel   Comprehensive metabolic panel   Ex-smoker    Quit smoking >20 yrs ago      Prediabetes    Update A1c.      Relevant Orders   Hemoglobin A1c   Habitual alcohol use    Encouraged decreased alcohol intake.       Erectile dysfunction    Continue viagra.       Vitamin B12 deficiency    Update levels      Relevant Orders   Vitamin B12   Peripheral neuropathy    Update labs. This may be some better since starting b replacement      Relevant Orders   TSH   CBC with Differential/Platelet   Serum protein electrophoresis with reflex   Essential hypertension    Chronic, elevated in office however home readings largely well controlled. Anticipate component of white coat hypertension. No med changes today.       Relevant Medications   amLODipine (NORVASC) 5 MG tablet   atorvastatin (LIPITOR) 40 MG tablet   sildenafil (VIAGRA) 100 MG tablet   Other Relevant Orders   Microalbumin / creatinine urine ratio   Thiamine deficiency    Update B1 level.      Relevant Orders   Vitamin B1   Other Visit Diagnoses     Special  screening for malignant neoplasm of prostate       Relevant Orders   PSA   Need for influenza vaccination       Relevant Orders   Flu Vaccine QUAD High Dose(Fluad) (Completed)        Meds ordered this encounter  Medications   amLODipine (NORVASC) 5 MG tablet    Sig: Take 1 tablet (5 mg total)  by mouth daily.    Dispense:  90 tablet    Refill:  3   atorvastatin (LIPITOR) 40 MG tablet    Sig: Take 1 tablet (40 mg total) by mouth daily.    Dispense:  90 tablet    Refill:  3   levothyroxine (SYNTHROID) 75 MCG tablet    Sig: Take 1 tablet (75 mcg total) by mouth daily.    Dispense:  90 tablet    Refill:  3   sildenafil (VIAGRA) 100 MG tablet    Sig: Take 0.5-1 tablets (50-100 mg total) by mouth daily as needed for erectile dysfunction.    Dispense:  10 tablet    Refill:  6   Orders Placed This Encounter  Procedures   Flu Vaccine QUAD High Dose(Fluad)   Vitamin B12   Vitamin B1   Lipid panel   Comprehensive metabolic panel   TSH   Hemoglobin A1c   PSA   CBC with Differential/Platelet   Serum protein electrophoresis with reflex   Microalbumin / creatinine urine ratio    Patient instructions: Labs today Flu shot today  If interested, check with pharmacy about new 2 shot shingles series (shingrix).  Schedule eye exam.  Continue monitoring blood pressure at home, let us know if consistently >140/90 Continue funginail to toenails daily for a year, let us know if not improving.  Scrotal spots - likely benign cherry angiomas of skin.  Good to see you today Return as needed or in 1 year for next physical.   Follow up plan: Return in about 1 year (around 10/19/2023) for annual exam, prior fasting for blood work, medicare wellness visit.  Ria Bush, MD

## 2022-10-18 NOTE — Patient Instructions (Addendum)
Labs today Flu shot today  If interested, check with pharmacy about new 2 shot shingles series (shingrix).  Schedule eye exam.  Continue monitoring blood pressure at home, let us know if consistently >140/90 Continue funginail to toenails daily for a year, let us know if not improving.  Scrotal spots - likely benign cherry angiomas of skin.  Good to see you today Return as needed or in 1 year for next physical.   Health Maintenance After Age 73 After age 4, you are at a higher risk for certain long-term diseases and infections as well as injuries from falls. Falls are a major cause of broken bones and head injuries in people who are older than age 59. Getting regular preventive care can help to keep you healthy and well. Preventive care includes getting regular testing and making lifestyle changes as recommended by your health care provider. Talk with your health care provider about: Which screenings and tests you should have. A screening is a test that checks for a disease when you have no symptoms. A diet and exercise plan that is right for you. What should I know about screenings and tests to prevent falls? Screening and testing are the best ways to find a health problem early. Early diagnosis and treatment give you the best chance of managing medical conditions that are common after age 39. Certain conditions and lifestyle choices may make you more likely to have a fall. Your health care provider may recommend: Regular vision checks. Poor vision and conditions such as cataracts can make you more likely to have a fall. If you wear glasses, make sure to get your prescription updated if your vision changes. Medicine review. Work with your health care provider to regularly review all of the medicines you are taking, including over-the-counter medicines. Ask your health care provider about any side effects that may make you more likely to have a fall. Tell your health care provider if any medicines  that you take make you feel dizzy or sleepy. Strength and balance checks. Your health care provider may recommend certain tests to check your strength and balance while standing, walking, or changing positions. Foot health exam. Foot pain and numbness, as well as not wearing proper footwear, can make you more likely to have a fall. Screenings, including: Osteoporosis screening. Osteoporosis is a condition that causes the bones to get weaker and break more easily. Blood pressure screening. Blood pressure changes and medicines to control blood pressure can make you feel dizzy. Depression screening. You may be more likely to have a fall if you have a fear of falling, feel depressed, or feel unable to do activities that you used to do. Alcohol use screening. Using too much alcohol can affect your balance and may make you more likely to have a fall. Follow these instructions at home: Lifestyle Do not drink alcohol if: Your health care provider tells you not to drink. If you drink alcohol: Limit how much you have to: 0-1 drink a day for women. 0-2 drinks a day for men. Know how much alcohol is in your drink. In the [place] one drink equals one 12 oz bottle of beer (355 mL), one 5 oz glass of wine (148 mL), or one 1 oz glass of hard liquor (44 mL). Do not use any products that contain nicotine or tobacco. These products include cigarettes, chewing tobacco, and vaping devices, such as e-cigarettes. If you need help quitting, ask your health care provider. Activity  Follow a regular exercise  program to stay fit. This will help you maintain your balance. Ask your health care provider what types of exercise are appropriate for you. If you need a cane or walker, use it as recommended by your health care provider. Wear supportive shoes that have nonskid soles. Safety  Remove any tripping hazards, such as rugs, cords, and clutter. Install safety equipment such as grab bars in bathrooms and safety rails on  stairs. Keep rooms and walkways well-lit. General instructions Talk with your health care provider about your risks for falling. Tell your health care provider if: You fall. Be sure to tell your health care provider about all falls, even ones that seem minor. You feel dizzy, tiredness (fatigue), or off-balance. Take over-the-counter and prescription medicines only as told by your health care provider. These include supplements. Eat a healthy diet and maintain a healthy weight. A healthy diet includes low-fat dairy products, low-fat (lean) meats, and fiber from whole grains, beans, and lots of fruits and vegetables. Stay current with your vaccines. Schedule regular health, dental, and eye exams. Summary Having a healthy lifestyle and getting preventive care can help to protect your health and wellness after age 79. Screening and testing are the best way to find a health problem early and help you avoid having a fall. Early diagnosis and treatment give you the best chance for managing medical conditions that are more common for people who are older than age 29. Falls are a major cause of broken bones and head injuries in people who are older than age 37. Take precautions to prevent a fall at home. Work with your health care provider to learn what changes you can make to improve your health and wellness and to prevent falls. This information is not intended to replace advice given to you by your health care provider. Make sure you discuss any questions you have with your health care provider. Document Revised: 05/07/2021 Document Reviewed: 05/07/2021 Elsevier Patient Education  [place].

## 2022-10-18 NOTE — Addendum Note (Signed)
Addended by: Carter Kitten on: 10/18/2022 10:09 AM   Modules accepted: Orders

## 2022-10-18 NOTE — Assessment & Plan Note (Signed)
Update B1 level.

## 2022-10-18 NOTE — Assessment & Plan Note (Signed)
Preventative protocols reviewed and updated unless pt declined. Discussed healthy diet and lifestyle.  

## 2022-10-18 NOTE — Assessment & Plan Note (Addendum)
Continue viagra.

## 2022-10-18 NOTE — Assessment & Plan Note (Signed)
Quit smoking >20 yrs ago

## 2022-10-18 NOTE — Assessment & Plan Note (Signed)
Update levels. 

## 2022-10-18 NOTE — Assessment & Plan Note (Addendum)
Update labs. This may be some better since starting b replacement

## 2022-10-18 NOTE — Assessment & Plan Note (Signed)

## 2022-10-21 LAB — MICROALBUMIN / CREATININE URINE RATIO
Creatinine,U: 56.2 mg/dL
Microalb Creat Ratio: 1.2 mg/g (ref 0.0–30.0)
Microalb, Ur: <0.7 mg/dL (ref 0.0–1.9)

## 2022-10-24 LAB — PROTEIN ELECTROPHORESIS, SERUM, WITH REFLEX
Albumin ELP: 4.4 g/dL (ref 3.8–4.8)
Alpha 1: 0.3 g/dL (ref 0.2–0.3)
Alpha 2: 0.7 g/dL (ref 0.5–0.9)
Beta 2: 0.4 g/dL (ref 0.2–0.5)
Beta Globulin: 0.4 g/dL (ref 0.4–0.6)
Gamma Globulin: 1 g/dL (ref 0.8–1.7)
Total Protein: 7.3 g/dL (ref 6.1–8.1)

## 2022-10-24 LAB — VITAMIN B1: Vitamin B1 (Thiamine): 12 nmol/L (ref 8–30)

## 2023-06-06 DIAGNOSIS — E039 Hypothyroidism, unspecified: Secondary | ICD-10-CM | POA: Diagnosis not present

## 2023-06-06 DIAGNOSIS — E785 Hyperlipidemia, unspecified: Secondary | ICD-10-CM | POA: Diagnosis not present

## 2023-06-06 DIAGNOSIS — Z809 Family history of malignant neoplasm, unspecified: Secondary | ICD-10-CM | POA: Diagnosis not present

## 2023-06-06 DIAGNOSIS — Z974 Presence of external hearing-aid: Secondary | ICD-10-CM | POA: Diagnosis not present

## 2023-06-06 DIAGNOSIS — Z87891 Personal history of nicotine dependence: Secondary | ICD-10-CM | POA: Diagnosis not present

## 2023-06-06 DIAGNOSIS — I10 Essential (primary) hypertension: Secondary | ICD-10-CM | POA: Diagnosis not present

## 2023-06-06 DIAGNOSIS — M1990 Unspecified osteoarthritis, unspecified site: Secondary | ICD-10-CM | POA: Diagnosis not present

## 2023-06-06 DIAGNOSIS — Z8249 Family history of ischemic heart disease and other diseases of the circulatory system: Secondary | ICD-10-CM | POA: Diagnosis not present

## 2023-06-06 DIAGNOSIS — N529 Male erectile dysfunction, unspecified: Secondary | ICD-10-CM | POA: Diagnosis not present

## 2023-07-11 ENCOUNTER — Other Ambulatory Visit: Payer: Self-pay | Admitting: Family Medicine

## 2023-07-11 DIAGNOSIS — E785 Hyperlipidemia, unspecified: Secondary | ICD-10-CM

## 2023-07-11 NOTE — Telephone Encounter (Signed)
Spoke to pt's wife, scheduled pt's cpe for 11/07/23

## 2023-07-11 NOTE — Telephone Encounter (Signed)
E-scribed refill.  Plz schedule CPE and fasting lab (no food/drink- except water and/or blk coffee 5 hrs prior) visits to prevent delays in future refills.  

## 2023-09-22 ENCOUNTER — Ambulatory Visit (INDEPENDENT_AMBULATORY_CARE_PROVIDER_SITE_OTHER): Payer: Medicare HMO

## 2023-09-22 ENCOUNTER — Encounter: Payer: Self-pay | Admitting: *Deleted

## 2023-09-22 VITALS — Ht 68.0 in | Wt 170.0 lb

## 2023-09-22 DIAGNOSIS — Z0000 Encounter for general adult medical examination without abnormal findings: Secondary | ICD-10-CM | POA: Diagnosis not present

## 2023-09-22 DIAGNOSIS — Z0100 Encounter for examination of eyes and vision without abnormal findings: Secondary | ICD-10-CM

## 2023-09-22 NOTE — Patient Instructions (Signed)
Henry Baldwin , Thank you for taking time to come for your Medicare Wellness Visit. I appreciate your ongoing commitment to your health goals. Please review the following plan we discussed and let me know if I can assist you in the future.   Referrals/Orders/Follow-Ups/Clinician Recommendations: Aim for 30 minutes of exercise or brisk walking, 6-8 glasses of water, and 5 servings of fruits and vegetables each day.   This is a list of the screening recommended for you and due dates:  Health Maintenance  Topic Date Due   Zoster (Shingles) Vaccine (1 of 2) Never done   Flu Shot  07/31/2023   COVID-19 Vaccine (5 - 2023-24 season) 08/31/2023   DTaP/Tdap/Td vaccine (3 - Td or Tdap) 03/31/2024   Medicare Annual Wellness Visit  09/21/2024   Colon Cancer Screening  08/28/2027   Pneumonia Vaccine  Completed   Hepatitis C Screening  Completed   HPV Vaccine  Aged Out    Advanced directives: (Copy Requested) Please bring a copy of your health care power of attorney and living will to the office to be added to your chart at your convenience.  Next Medicare Annual Wellness Visit scheduled for next year: Yes  insert Preventive Care attachment Insert FALL PREVENTION attachment if needed

## 2023-09-22 NOTE — Progress Notes (Signed)
Subjective:   Henry Baldwin. is a 72 y.o. male who presents for Medicare Annual/Subsequent preventive examination.  Visit Complete: Virtual  I connected with  [name]. on 09/22/23 by a audio enabled telemedicine application and verified that I am speaking with the correct person using two identifiers.  Patient Location: Home  Provider Location: Home Office  I discussed the limitations of evaluation and management by telemedicine. The patient expressed understanding and agreed to proceed.  Patient Medicare AWV questionnaire was completed by the patient on 09/22/2023; I have confirmed that all information answered by patient is correct and no changes since this date.  Cardiac Risk Factors include: advanced age (>64men, >65 women);male genderVital Signs: Unable to obtain new vitals due to this being a telehealth visit.      Objective:    Today's Vitals   09/22/23 1405  Weight: 170 lb (77.1 kg)  Height: 5\' 8"  (1.727 m)   Body mass index is 25.85 kg/m.     09/22/2023    2:09 PM 07/19/2019    1:10 PM 07/13/2018    9:24 AM 05/21/2017    9:31 AM 05/15/2016    8:20 AM 02/06/2016   12:33 PM 01/23/2016    2:42 PM  Advanced Directives  Does Patient Have a Medical Advance Directive? Yes Yes No Yes Yes Yes Yes  Type of Estate agent of [place];Living will Healthcare Power of [place];Living will  Living will Healthcare Power of [place];Living will Healthcare Power of [place];Living will Living will;Healthcare Power of Attorney  Does patient want to make changes to medical advance directive?  No - Patient declined   No - Patient declined    Copy of Healthcare Power of Attorney in Chart? No - copy requested No - copy requested   No - copy requested    Would patient like information on creating a medical advance directive?   No - Patient declined        Current Medications (verified) Outpatient Encounter Medications as of 09/22/2023  Medication Sig    amLODipine (NORVASC) 5 MG tablet Take 1 tablet (5 mg total) by mouth daily.   aspirin 325 MG tablet Take 325 mg by mouth once a week.   atorvastatin (LIPITOR) 40 MG tablet TAKE 1 TABLET BY MOUTH EVERY DAY   levothyroxine (SYNTHROID) 75 MCG tablet Take 1 tablet (75 mcg total) by mouth daily.   sildenafil (VIAGRA) 100 MG tablet Take 0.5-1 tablets (50-100 mg total) by mouth daily as needed for erectile dysfunction.   thiamine (VITAMIN B-1) 50 MG tablet Take 1 tablet (50 mg total) by mouth daily.   vitamin B-12 1000 MCG tablet Take 1 tablet (1,000 mcg total) by mouth every other day.   No facility-administered encounter medications on file as of 09/22/2023.    Allergies (verified) Patient has no known allergies.   History: Past Medical History:  Diagnosis Date   Carotid stenosis, asymptomatic 2016   1-39% B, rpt 2 yrs   Cataract    Cyst of right kidney 2015   2cm, stable on rpt [place]   DJD (degenerative joint disease), lumbar    HLD (hyperlipidemia)    Borderline   HTN (hypertension)    Hypothyroid    Lyme disease 05/18/2013   Nephrolithiasis 02/2014   Past Surgical History:  Procedure Laterality Date   COLONOSCOPY  10/28/01   with polypectomy   COLONOSCOPY  10/19/2004   Divertics; no polyps   COLONOSCOPY  11/20/10   Normal-Dr. Marina Goodell  COLONOSCOPY  01/2016   TA, mod diverticulosis, f/u 5 yrs Marina Goodell)   FOREIGN BODY REMOVAL Left 2007   fifth finger   Family History  Problem Relation Age of Onset   Colon cancer Mother 65   Congestive Heart Failure Mother    Hypertension Father    Thyroid disease Father    Thyroid disease Sister    Alcohol abuse Brother    Heart disease Brother    Thyroid disease Brother    Cancer Paternal Uncle        prostate   Colon polyps Neg Hx    Esophageal cancer Neg Hx    Stomach cancer Neg Hx    Rectal cancer Neg Hx    Social History   Socioeconomic History   Marital status: Married    Spouse name: Not on file   Number of children: 1    Years of education: Not on file   Highest education level: Not on file  Occupational History   Occupation: Research officer, political party: [employer]  Tobacco Use   Smoking status: Former    Current packs/day: 0.00    Types: Cigarettes    Quit date: 12/30/1998    Years since quitting: 24.7   Smokeless tobacco: Never  Vaping Use   Vaping status: Never Used  Substance and Sexual Activity   Alcohol use: Yes    Alcohol/week: 32.0 standard drinks of alcohol    Types: 32 Cans of beer per week   Drug use: No   Sexual activity: Yes  Other Topics Concern   Not on file  Social History Narrative   Married, lives with wife, new cat   1 child   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[employer]   Activity: outdoor work   Diet: good water, vegetables daily   Social Determinants of Health   Financial Resource Strain: Low Risk  (09/22/2023)   Overall Financial Resource Strain (CARDIA)    Difficulty of Paying Living Expenses: Not hard at all  Food Insecurity: No Food Insecurity (09/22/2023)   Hunger Vital Sign    Worried About Running Out of Food in the Last Year: Never true    Ran Out of Food in the Last Year: Never true  Transportation Needs: No Transportation Needs (09/22/2023)   PRAPARE - Administrator, Civil Service (Medical): No    Lack of Transportation (Non-Medical): No  Physical Activity: Insufficiently Active (09/22/2023)   Exercise Vital Sign    Days of Exercise per Week: 3 days    Minutes of Exercise per Session: 30 min  Stress: No Stress Concern Present (09/22/2023)   [employer] of Occupational Health - Occupational Stress Questionnaire    Feeling of Stress : Not at all  Social Connections: Moderately Integrated (09/22/2023)   Social Connection and Isolation Panel [NHANES]    Frequency of Communication with Friends and Family: More than three times a week    Frequency of Social Gatherings with Friends and Family: More than three times a week    Attends Religious Services:  Never    Database administrator or Organizations: Yes    Attends Engineer, structural: More than 4 times per year    Marital Status: Married    Tobacco Counseling Counseling given: Not Answered   Clinical Intake:  Pre-visit preparation completed: Yes  Pain : No/denies pain     Nutritional Risks: None Diabetes: No  How often do you need to have someone help you when you read  instructions, pamphlets, or other written materials from your doctor or pharmacy?: 1 - Never  Interpreter Needed?: No  Information entered by :: Renie Ora, LPN   Activities of Daily Living    09/22/2023    2:10 PM  In your present state of health, do you have any difficulty performing the following activities:  Hearing? 0  Vision? 0  Difficulty concentrating or making decisions? 0  Walking or climbing stairs? 0  Dressing or bathing? 0  Doing errands, shopping? 0  Preparing Food and eating ? N  Using the Toilet? N  In the past six months, have you accidently leaked urine? N  Do you have problems with loss of bowel control? N  Managing your Medications? N  Managing your Finances? N  Housekeeping or managing your Housekeeping? N    Patient Care Team: Eustaquio Boyden, MD as PCP - General (Family Medicine) Marcelle Overlie, MS, CCC-A as Referring Physician (Audiology) Lia Hopping, MD as Referring Physician (Ophthalmology) Wynona Meals., MD as Referring Physician (Dentistry) Vilinda Flake, [facility] (Inactive) as Pharmacist (Pharmacist)  Indicate any recent Medical Services you may have received from other than [facility] providers in the past year (date may be approximate).     Assessment:   This is a routine wellness examination for Jeffrie.  Hearing/Vision screen Vision Screening - Comments:: Referral 09/22/2023   Goals Addressed             This Visit's Progress    Increase physical activity   On track    Starting 05/21/2017, I will continue to do yard work and chop  wood for at least 3-4 hrs for 6 days per week as weather permits.        Depression Screen    09/22/2023    2:08 PM 10/18/2022   10:05 AM 10/16/2021    8:50 AM 07/21/2020    9:31 AM 07/19/2019    1:10 PM 07/13/2018    9:24 AM 05/21/2017    9:31 AM  PHQ 2/9 Scores  PHQ - 2 Score 0 0 0 0 0 0 0  PHQ- 9 Score  0   0 0     Fall Risk    09/22/2023    2:07 PM 10/18/2022    8:55 AM 10/16/2021    8:49 AM 07/21/2020    9:31 AM 07/19/2019    1:10 PM  Fall Risk   Falls in the past year? 0 0 0 0 0  Number falls in past yr: 0      Injury with Fall? 0      Risk for fall due to : No Fall Risks    Medication side effect  Follow up Falls prevention discussed    Falls evaluation completed;Falls prevention discussed    MEDICARE RISK AT HOME: Medicare Risk at Home Any stairs in or around the home?: Yes If so, are there any without handrails?: No Home free of loose throw rugs in walkways, pet beds, electrical cords, etc?: Yes Adequate lighting in your home to reduce risk of falls?: Yes Life alert?: No Use of a cane, walker or w/c?: No Grab bars in the bathroom?: Yes Shower chair or bench in shower?: Yes Elevated toilet seat or a handicapped toilet?: Yes  TIMED UP AND GO:  Was the test performed?  No    Cognitive Function:    07/19/2019    1:15 PM 07/13/2018    9:24 AM 05/21/2017    9:10 AM 05/15/2016    8:45 AM  MMSE - Mini Mental State Exam  Orientation to time 5 5 5 5   Orientation to Place 5 5 5 5   Registration 3 3 3 3   Attention/ Calculation 3 0 0 0  Recall 3 3 3 3   Language- name 2 objects 0 0 0 0  Language- repeat 1 1 1 1   Language- follow 3 step command 0 3 3 3   Language- read & follow direction 0 0 0 0  Write a sentence 0 0 0 0  Copy design 0 0 0 0  Total score 20 20 20 20         09/22/2023    2:10 PM  6CIT Screen  What Year? 0 points  What month? 0 points  What time? 0 points  Count back from 20 0 points  Months in reverse 0 points  Repeat phrase 0 points   Total Score 0 points    Immunizations Immunization History  Administered Date(s) Administered   Fluad Quad(high Dose 65+) 10/18/2022   Influenza Whole 11/12/2005, 01/14/2013   Influenza, High Dose Seasonal PF 09/11/2017, 09/07/2021   Influenza,inj,Quad PF,6+ Mos 10/05/2014, 09/19/2016   Influenza-Unspecified 09/30/2013, 10/30/2018   PFIZER(Purple Top)SARS-COV-2 Vaccination 01/20/2020, 02/10/2020, 12/04/2020   Pfizer Covid-19 Vaccine Bivalent Booster 23yrs & up 09/07/2021   Pneumococcal Conjugate-13 03/28/2015   Pneumococcal Polysaccharide-23 05/22/2016   Td 09/03/2001   Tdap 03/31/2014    TDAP status: Up to date  Flu Vaccine status: Up to date  Pneumococcal vaccine status: Up to date  Covid-19 vaccine status: Completed vaccines  Qualifies for Shingles Vaccine? Yes   Zostavax completed No   Shingrix Completed?: No.    Education has been provided regarding the importance of this vaccine. Patient has been advised to call insurance company to determine out of pocket expense if they have not yet received this vaccine. Advised may also receive vaccine at local pharmacy or Health Dept. Verbalized acceptance and understanding.  Screening Tests Health Maintenance  Topic Date Due   Zoster Vaccines- Shingrix (1 of 2) Never done   INFLUENZA VACCINE  07/31/2023   COVID-19 Vaccine (5 - 2023-24 season) 08/31/2023   DTaP/Tdap/Td (3 - Td or Tdap) 03/31/2024   Medicare Annual Wellness (AWV)  09/21/2024   Colonoscopy  08/28/2027   Pneumonia Vaccine 13+ Years old  Completed   Hepatitis C Screening  Completed   HPV VACCINES  Aged Out    Health Maintenance  Health Maintenance Due  Topic Date Due   Zoster Vaccines- Shingrix (1 of 2) Never done   INFLUENZA VACCINE  07/31/2023   COVID-19 Vaccine (5 - 2023-24 season) 08/31/2023    Colorectal cancer screening: Type of screening: Colonoscopy. Completed 08/27/2022. Repeat every 5 years  Lung Cancer Screening: (Low Dose CT Chest  recommended if Age 32-80 years, 20 pack-year currently smoking OR have quit w/in 15years.) does not qualify.   Lung Cancer Screening Referral: n/a  Additional Screening:  Hepatitis C Screening: does not qualify; Completed 05/15/2016  Vision Screening: Recommended annual ophthalmology exams for early detection of glaucoma and other disorders of the eye. Is the patient up to date with their annual eye exam?  No  Who is the provider or what is the name of the office in which the patient attends annual eye exams? None referral 09/22/2023 If pt is not established with a provider, would they like to be referred to a provider to establish care? No .   Dental Screening: Recommended annual dental exams for proper oral hygiene   [employer]  Referral / Chronic Care Management: CRR required this visit?  No   CCM required this visit?  No     Plan:     I have personally reviewed and noted the following in the patient's chart:   Medical and social history Use of alcohol, tobacco or illicit drugs  Current medications and supplements including opioid prescriptions. Patient is not currently taking opioid prescriptions. Functional ability and status Nutritional status Physical activity Advanced directives List of other physicians Hospitalizations, surgeries, and ER visits in previous 12 months Vitals Screenings to include cognitive, depression, and falls Referrals and appointments  In addition, I have reviewed and discussed with patient certain preventive protocols, quality metrics, and best practice recommendations. A written personalized care plan for preventive services as well as general preventive health recommendations were provided to patient.     Lorrene Reid, LPN   04/07/8118   After Visit Summary: (MyChart) Due to this being a telephonic visit, the after visit summary with patients personalized plan was offered to patient via MyChart   Nurse Notes: none

## 2023-09-29 DIAGNOSIS — H903 Sensorineural hearing loss, bilateral: Secondary | ICD-10-CM | POA: Diagnosis not present

## 2023-10-09 ENCOUNTER — Other Ambulatory Visit: Payer: Self-pay | Admitting: Family Medicine

## 2023-10-10 NOTE — Telephone Encounter (Signed)
ERx 

## 2023-10-28 ENCOUNTER — Other Ambulatory Visit: Payer: Self-pay | Admitting: Family Medicine

## 2023-10-28 DIAGNOSIS — E538 Deficiency of other specified B group vitamins: Secondary | ICD-10-CM

## 2023-10-28 DIAGNOSIS — E785 Hyperlipidemia, unspecified: Secondary | ICD-10-CM

## 2023-10-28 DIAGNOSIS — I10 Essential (primary) hypertension: Secondary | ICD-10-CM

## 2023-10-28 DIAGNOSIS — E519 Thiamine deficiency, unspecified: Secondary | ICD-10-CM

## 2023-10-28 DIAGNOSIS — F1090 Alcohol use, unspecified, uncomplicated: Secondary | ICD-10-CM

## 2023-10-28 DIAGNOSIS — E039 Hypothyroidism, unspecified: Secondary | ICD-10-CM

## 2023-10-28 DIAGNOSIS — R7303 Prediabetes: Secondary | ICD-10-CM

## 2023-10-28 DIAGNOSIS — Z125 Encounter for screening for malignant neoplasm of prostate: Secondary | ICD-10-CM

## 2023-10-31 ENCOUNTER — Other Ambulatory Visit (INDEPENDENT_AMBULATORY_CARE_PROVIDER_SITE_OTHER): Payer: Medicare HMO

## 2023-10-31 DIAGNOSIS — E519 Thiamine deficiency, unspecified: Secondary | ICD-10-CM

## 2023-10-31 DIAGNOSIS — R7303 Prediabetes: Secondary | ICD-10-CM | POA: Diagnosis not present

## 2023-10-31 DIAGNOSIS — F1090 Alcohol use, unspecified, uncomplicated: Secondary | ICD-10-CM | POA: Diagnosis not present

## 2023-10-31 DIAGNOSIS — I10 Essential (primary) hypertension: Secondary | ICD-10-CM | POA: Diagnosis not present

## 2023-10-31 DIAGNOSIS — E039 Hypothyroidism, unspecified: Secondary | ICD-10-CM

## 2023-10-31 DIAGNOSIS — E785 Hyperlipidemia, unspecified: Secondary | ICD-10-CM | POA: Diagnosis not present

## 2023-10-31 DIAGNOSIS — E538 Deficiency of other specified B group vitamins: Secondary | ICD-10-CM

## 2023-10-31 DIAGNOSIS — Z125 Encounter for screening for malignant neoplasm of prostate: Secondary | ICD-10-CM | POA: Diagnosis not present

## 2023-10-31 LAB — CBC WITH DIFFERENTIAL/PLATELET
Basophils Absolute: 0 10*3/uL (ref 0.0–0.1)
Basophils Relative: 0.6 % (ref 0.0–3.0)
Eosinophils Absolute: 0.2 10*3/uL (ref 0.0–0.7)
Eosinophils Relative: 2.4 % (ref 0.0–5.0)
HCT: 41.3 % (ref 39.0–52.0)
Hemoglobin: 13.6 g/dL (ref 13.0–17.0)
Lymphocytes Relative: 28.1 % (ref 12.0–46.0)
Lymphs Abs: 1.9 10*3/uL (ref 0.7–4.0)
MCHC: 32.8 g/dL (ref 30.0–36.0)
MCV: 97.9 fl (ref 78.0–100.0)
Monocytes Absolute: 0.6 10*3/uL (ref 0.1–1.0)
Monocytes Relative: 9.6 % (ref 3.0–12.0)
Neutro Abs: 4 10*3/uL (ref 1.4–7.7)
Neutrophils Relative %: 59.3 % (ref 43.0–77.0)
Platelets: 370 10*3/uL (ref 150.0–400.0)
RBC: 4.22 Mil/uL (ref 4.22–5.81)
RDW: 13.5 % (ref 11.5–15.5)
WBC: 6.7 10*3/uL (ref 4.0–10.5)

## 2023-10-31 LAB — VITAMIN B12: Vitamin B-12: 593 pg/mL (ref 211–911)

## 2023-10-31 LAB — COMPREHENSIVE METABOLIC PANEL WITH GFR
ALT: 12 U/L (ref 0–53)
AST: 15 U/L (ref 0–37)
Albumin: 3.9 g/dL (ref 3.5–5.2)
Alkaline Phosphatase: 82 U/L (ref 39–117)
BUN: 14 mg/dL (ref 6–23)
CO2: 27 meq/L (ref 19–32)
Calcium: 9.1 mg/dL (ref 8.4–10.5)
Chloride: 102 meq/L (ref 96–112)
Creatinine, Ser: 0.92 mg/dL (ref 0.40–1.50)
GFR: 82.13 mL/min
Glucose, Bld: 100 mg/dL — ABNORMAL HIGH (ref 70–99)
Potassium: 4.9 meq/L (ref 3.5–5.1)
Sodium: 138 meq/L (ref 135–145)
Total Bilirubin: 0.5 mg/dL (ref 0.2–1.2)
Total Protein: 6.6 g/dL (ref 6.0–8.3)

## 2023-10-31 LAB — LIPID PANEL
Cholesterol: 136 mg/dL (ref 0–200)
HDL: 54.9 mg/dL
LDL Cholesterol: 70 mg/dL (ref 0–99)
NonHDL: 80.67
Total CHOL/HDL Ratio: 2
Triglycerides: 52 mg/dL (ref 0.0–149.0)
VLDL: 10.4 mg/dL (ref 0.0–40.0)

## 2023-10-31 LAB — HEMOGLOBIN A1C: Hgb A1c MFr Bld: 6.2 % (ref 4.6–6.5)

## 2023-10-31 LAB — TSH: TSH: 4.83 u[IU]/mL (ref 0.35–5.50)

## 2023-10-31 LAB — PSA: PSA: 0.68 ng/mL (ref 0.10–4.00)

## 2023-10-31 LAB — MICROALBUMIN / CREATININE URINE RATIO
Creatinine,U: 69.5 mg/dL
Microalb Creat Ratio: 1 mg/g (ref 0.0–30.0)
Microalb, Ur: <0.7 mg/dL (ref 0.0–1.9)

## 2023-10-31 LAB — FOLATE: Folate: 11.5 ng/mL

## 2023-11-05 DIAGNOSIS — H2513 Age-related nuclear cataract, bilateral: Secondary | ICD-10-CM | POA: Diagnosis not present

## 2023-11-05 DIAGNOSIS — H52223 Regular astigmatism, bilateral: Secondary | ICD-10-CM | POA: Diagnosis not present

## 2023-11-05 LAB — VITAMIN B1: Vitamin B1 (Thiamine): 24 nmol/L (ref 8–30)

## 2023-11-07 ENCOUNTER — Ambulatory Visit (INDEPENDENT_AMBULATORY_CARE_PROVIDER_SITE_OTHER): Payer: Medicare HMO | Admitting: Family Medicine

## 2023-11-07 ENCOUNTER — Encounter: Payer: Self-pay | Admitting: Family Medicine

## 2023-11-07 VITALS — BP 128/70 | HR 70 | Temp 98.3°F | Ht 67.25 in | Wt 169.2 lb

## 2023-11-07 DIAGNOSIS — G6289 Other specified polyneuropathies: Secondary | ICD-10-CM

## 2023-11-07 DIAGNOSIS — E039 Hypothyroidism, unspecified: Secondary | ICD-10-CM | POA: Diagnosis not present

## 2023-11-07 DIAGNOSIS — N529 Male erectile dysfunction, unspecified: Secondary | ICD-10-CM

## 2023-11-07 DIAGNOSIS — E519 Thiamine deficiency, unspecified: Secondary | ICD-10-CM

## 2023-11-07 DIAGNOSIS — F1090 Alcohol use, unspecified, uncomplicated: Secondary | ICD-10-CM | POA: Diagnosis not present

## 2023-11-07 DIAGNOSIS — E538 Deficiency of other specified B group vitamins: Secondary | ICD-10-CM | POA: Diagnosis not present

## 2023-11-07 DIAGNOSIS — I10 Essential (primary) hypertension: Secondary | ICD-10-CM | POA: Diagnosis not present

## 2023-11-07 DIAGNOSIS — Z7189 Other specified counseling: Secondary | ICD-10-CM | POA: Diagnosis not present

## 2023-11-07 DIAGNOSIS — R7303 Prediabetes: Secondary | ICD-10-CM | POA: Diagnosis not present

## 2023-11-07 DIAGNOSIS — Z0000 Encounter for general adult medical examination without abnormal findings: Secondary | ICD-10-CM

## 2023-11-07 DIAGNOSIS — E785 Hyperlipidemia, unspecified: Secondary | ICD-10-CM | POA: Diagnosis not present

## 2023-11-07 MED ORDER — LEVOTHYROXINE SODIUM 75 MCG PO TABS: 75.0000 ug | ORAL_TABLET | Freq: Every day | ORAL | 4 refills | Status: DC

## 2023-11-07 MED ORDER — SILDENAFIL CITRATE 100 MG PO TABS: 50.0000 mg | ORAL_TABLET | Freq: Every day | ORAL | 12 refills | Status: AC | PRN

## 2023-11-07 MED ORDER — AMLODIPINE BESYLATE 5 MG PO TABS: 5.0000 mg | ORAL_TABLET | Freq: Every day | ORAL | 4 refills | Status: DC

## 2023-11-07 MED ORDER — ATORVASTATIN CALCIUM 40 MG PO TABS: 40.0000 mg | ORAL_TABLET | Freq: Every day | ORAL | 4 refills | Status: DC

## 2023-11-07 NOTE — Assessment & Plan Note (Signed)
Stable period on daily b12 replacement.

## 2023-11-07 NOTE — Assessment & Plan Note (Signed)
Chronic, stable. Continue current regimen. 

## 2023-11-07 NOTE — Patient Instructions (Addendum)
Try antihistamine (zyrtec) and if no better may try nasal steroid (flonase).  Bring [place] a copy of your living will  Good to see you today Return as needed or 1 year for next physical

## 2023-11-07 NOTE — Assessment & Plan Note (Signed)
Advanced directive discussion - has at home. HCPOA is wife. Asked to bring [place] copy.

## 2023-11-07 NOTE — Assessment & Plan Note (Addendum)
Discussed this. Encouraged cessation due to health risks of ongoing use. He is not interested in decreasing alcohol intake at this time.

## 2023-11-07 NOTE — Assessment & Plan Note (Signed)
Chronic, stable on atorvastatin - continue. LDL 70 at goal.  The 10-year ASCVD risk score (Arnett DK, et al., 2019) is: 22.8%   Values used to calculate the score:     Age: 72 years     Sex: Male     Is Non-Hispanic African American: No     Diabetic: No     Tobacco smoker: No     Systolic Blood Pressure: 128 mmHg     Is BP treated: Yes     HDL Cholesterol: 54.9 mg/dL     Total Cholesterol: 136 mg/dL

## 2023-11-07 NOTE — Assessment & Plan Note (Signed)
Continue b1 replacement.

## 2023-11-07 NOTE — Assessment & Plan Note (Signed)
Chronic, stable. Continue amlodipine.  

## 2023-11-07 NOTE — Progress Notes (Signed)
Ph: [phone] Fax: [phone]   Patient ID: Henry Baldwin., male    DOB: 06-05-1951, 72 y.o.   MRN: [id number]  This visit was conducted in person.  BP 128/70   Pulse 70   Temp 98.3 F (36.8 C) (Oral)   Ht 5' 7.25" (1.708 m)   Wt 169 lb 4 oz (76.8 kg)   SpO2 97%   BMI 26.31 kg/m   BP Readings from Last 3 Encounters:  11/07/23 128/70  10/18/22 (!) 150/72  08/27/22 104/65   CC: CPE Subjective:   HPI: Henry Baldwin. is a 72 y.o. male presenting on 11/07/2023 for Medicare Wellness (Pt accompanied by wife, Steward Drone.)   Saw health advisor 08/2023 for medicare wellness visit. Note reviewed.   Hearing Screening - Comments:: Wears B hearing aids. Wearing at today's OV.  Vision Screening - Comments:: Last eye exam, 10/2023.  Flowsheet Row Office Visit from 11/07/2023 in [facility]  PHQ-2 Total Score 0          11/07/2023    2:28 PM 09/22/2023    2:07 PM 10/18/2022    8:55 AM 10/16/2021    8:49 AM 07/21/2020    9:31 AM  Fall Risk   Falls in the past year? 0 0 0 0 0  Number falls in past yr:  0     Injury with Fall?  0     Risk for fall due to :  No Fall Risks     Follow up  Falls prevention discussed      Wife is having significant health issues.   Received flu shot and pneumococcal (prevnar 20) last month. Since then has had persistent intermittent hoarse voice, mucous in the mornings.   Notes intermittent allergic rhinitis symptoms - watery eyes, rhinitis. Claritin has helped.  Continues staying active outdoors   Hand numbness L>R, ongoing for years, worse with vibration of leaf blower. Some improvement with vitamin replacement.    ED - ongoing trouble maintaining erection. He's been taking full viagra 100mg  with limited benefit.   Preventative: Colonoscopy 07/2022 - mod diverticulosis, int hem, no f/u needed Marina Goodell) Prostate cancer screening - yearly PSA. Nocturia x1, occasional dribble.  Lung cancer screening - quit  20 yrs ago  Flu shot - yearly COVID vaccine - Pfizer 01/2020, booster 11/2020, bivalent 08/2021 Tdap - 03/2014 Prevnar-13 2016, pneumovax 2017, prevnar-20 09/2023 Shingles shot - discussed, declines.  Advanced directive discussion - has at home. HCPOA is wife. Asked to bring [place] copy.  Seat belt use discussed Sunscreen use discussed. No changing moles on skin.  Ex smoker - quit 2000  Alcohol - heavy drinker on weekends (6) - 24 beers on weekend, no alcohol on M-W. Not interested in cutting down.  Dentist Q6 mo  Eye exam - seen this week  Bowel - no diarrhea/constipation  Bladder - no incontinence   Married, lives with wife 1 child  Occ: [employer], retired, now cares for a few lawns.  Activity: outdoor work  Diet: good water, some low cal gatorade, vegetables daily      Relevant past medical, surgical, family and social history reviewed and updated as indicated. Interim medical history since our last visit reviewed. Allergies and medications reviewed and updated. Outpatient Medications Prior to Visit  Medication Sig Dispense Refill   aspirin 325 MG tablet Take 325 mg by mouth once a week.     thiamine (VITAMIN B-1) 50 MG tablet Take 1 tablet (  50 mg total) by mouth daily.     vitamin B-12 1000 MCG tablet Take 1 tablet (1,000 mcg total) by mouth every other day.     amLODipine (NORVASC) 5 MG tablet TAKE 1 TABLET (5 MG TOTAL) BY MOUTH DAILY. 90 tablet 0   atorvastatin (LIPITOR) 40 MG tablet TAKE 1 TABLET BY MOUTH EVERY DAY 90 tablet 1   levothyroxine (SYNTHROID) 75 MCG tablet TAKE 1 TABLET BY MOUTH EVERY DAY 90 tablet 0   sildenafil (VIAGRA) 100 MG tablet Take 0.5-1 tablets (50-100 mg total) by mouth daily as needed for erectile dysfunction. 10 tablet 6   No facility-administered medications prior to visit.     Per HPI unless specifically indicated in ROS section below Review of Systems  Constitutional:  Negative for activity change, appetite change, chills,  fatigue, fever and unexpected weight change.  HENT:  Negative for hearing loss.   Eyes:  Negative for visual disturbance.  Respiratory:  Positive for cough. Negative for chest tightness, shortness of breath and wheezing.   Cardiovascular:  Negative for chest pain, palpitations and leg swelling.  Gastrointestinal:  Negative for abdominal distention, abdominal pain, blood in stool, constipation, diarrhea, nausea and vomiting.  Genitourinary:  Negative for difficulty urinating and hematuria.  Musculoskeletal:  Negative for arthralgias, myalgias and neck pain.  Skin:  Negative for rash.  Neurological:  Negative for dizziness, seizures, syncope and headaches.  Hematological:  Negative for adenopathy. Bruises/bleeds easily.  Psychiatric/Behavioral:  Negative for dysphoric mood. The patient is not nervous/anxious.     Objective:  BP 128/70   Pulse 70   Temp 98.3 F (36.8 C) (Oral)   Ht 5' 7.25" (1.708 m)   Wt 169 lb 4 oz (76.8 kg)   SpO2 97%   BMI 26.31 kg/m   Wt Readings from Last 3 Encounters:  11/07/23 169 lb 4 oz (76.8 kg)  09/22/23 170 lb (77.1 kg)  10/18/22 171 lb 2 oz (77.6 kg)      Physical Exam Vitals and nursing note reviewed.  Constitutional:      General: He is not in acute distress.    Appearance: Normal appearance. He is well-developed. He is not ill-appearing.  HENT:     Head: Normocephalic and atraumatic.     Right Ear: Hearing, tympanic membrane, ear canal and external ear normal.     Left Ear: Hearing, tympanic membrane, ear canal and external ear normal.     Nose: Nose normal.     Mouth/Throat:     Mouth: Mucous membranes are moist.     Pharynx: Oropharynx is clear. No oropharyngeal exudate or posterior oropharyngeal erythema.  Eyes:     General: No scleral icterus.    Extraocular Movements: Extraocular movements intact.     Conjunctiva/sclera: Conjunctivae normal.     Pupils: Pupils are equal, round, and reactive to light.  Neck:     Thyroid: No thyroid  mass or thyromegaly.     Vascular: Carotid bruit (left) present.  Cardiovascular:     Rate and Rhythm: Normal rate and regular rhythm.     Pulses: Normal pulses.          Radial pulses are 2+ on the right side and 2+ on the left side.     Heart sounds: Normal heart sounds. No murmur heard. Pulmonary:     Effort: Pulmonary effort is normal. No respiratory distress.     Breath sounds: Normal breath sounds. No wheezing, rhonchi or rales.  Abdominal:     General:  Bowel sounds are normal. There is no distension.     Palpations: Abdomen is soft. There is no mass.     Tenderness: There is no abdominal tenderness. There is no guarding or rebound.     Hernia: No hernia is present.  Musculoskeletal:        General: Normal range of motion.     Cervical back: Normal range of motion and neck supple.     Right lower leg: No edema.     Left lower leg: No edema.  Lymphadenopathy:     Cervical: No cervical adenopathy.  Skin:    General: Skin is warm and dry.     Findings: No rash.  Neurological:     General: No focal deficit present.     Mental Status: He is alert and oriented to person, place, and time.  Psychiatric:        Mood and Affect: Mood normal.        Behavior: Behavior normal.        Thought Content: Thought content normal.        Judgment: Judgment normal.       Results for orders placed or performed in visit on 10/31/23  Folate  Result Value Ref Range   Folate 11.5 >5.9 ng/mL  CBC with Differential/Platelet  Result Value Ref Range   WBC 6.7 4.0 - 10.5 K/uL   RBC 4.22 4.22 - 5.81 Mil/uL   Hemoglobin 13.6 13.0 - 17.0 g/dL   HCT 96.2 95.2 - 84.1 %   MCV 97.9 78.0 - 100.0 fl   MCHC 32.8 30.0 - 36.0 g/dL   RDW 32.4 40.1 - 02.7 %   Platelets 370.0 150.0 - 400.0 K/uL   Neutrophils Relative % 59.3 43.0 - 77.0 %   Lymphocytes Relative 28.1 12.0 - 46.0 %   Monocytes Relative 9.6 3.0 - 12.0 %   Eosinophils Relative 2.4 0.0 - 5.0 %   Basophils Relative 0.6 0.0 - 3.0 %   Neutro  Abs 4.0 1.4 - 7.7 K/uL   Lymphs Abs 1.9 0.7 - 4.0 K/uL   Monocytes Absolute 0.6 0.1 - 1.0 K/uL   Eosinophils Absolute 0.2 0.0 - 0.7 K/uL   Basophils Absolute 0.0 0.0 - 0.1 K/uL  Vitamin B1  Result Value Ref Range   Vitamin B1 (Thiamine) 24 8 - 30 nmol/L  Vitamin B12  Result Value Ref Range   Vitamin B-12 593 211 - 911 pg/mL  PSA  Result Value Ref Range   PSA 0.68 0.10 - 4.00 ng/mL  Hemoglobin A1c  Result Value Ref Range   Hgb A1c MFr Bld 6.2 4.6 - 6.5 %  Microalbumin / creatinine urine ratio  Result Value Ref Range   Microalb, Ur <0.7 0.0 - 1.9 mg/dL   Creatinine,U 25.3 mg/dL   Microalb Creat Ratio 1.0 0.0 - 30.0 mg/g  TSH  Result Value Ref Range   TSH 4.83 0.35 - 5.50 uIU/mL  Comprehensive metabolic panel  Result Value Ref Range   Sodium 138 135 - 145 mEq/L   Potassium 4.9 3.5 - 5.1 mEq/L   Chloride 102 96 - 112 mEq/L   CO2 27 19 - 32 mEq/L   Glucose, Bld 100 (H) 70 - 99 mg/dL   BUN 14 6 - 23 mg/dL   Creatinine, Ser 6.64 0.40 - 1.50 mg/dL   Total Bilirubin 0.5 0.2 - 1.2 mg/dL   Alkaline Phosphatase 82 39 - 117 U/L   AST 15 0 - 37 U/L  ALT 12 0 - 53 U/L   Total Protein 6.6 6.0 - 8.3 g/dL   Albumin 3.9 3.5 - 5.2 g/dL   GFR 66.06 >30.16 mL/min   Calcium 9.1 8.4 - 10.5 mg/dL  Lipid panel  Result Value Ref Range   Cholesterol 136 0 - 200 mg/dL   Triglycerides 01.0 0.0 - 149.0 mg/dL   HDL 93.23 >55.73 mg/dL   VLDL 22.0 0.0 - 25.4 mg/dL   LDL Cholesterol 70 0 - 99 mg/dL   Total CHOL/HDL Ratio 2    NonHDL 80.67     Assessment & Plan:   Problem List Items Addressed This Visit     Advanced care planning/counseling discussion (Chronic)    Advanced directive discussion - has at home. HCPOA is wife. Asked to bring [place] copy.       Health maintenance examination - Primary (Chronic)    Preventative protocols reviewed and updated unless pt declined. Discussed healthy diet and lifestyle.       Hypothyroidism    Chronic, stable. Continue current regimen.        Relevant Medications   levothyroxine (SYNTHROID) 75 MCG tablet   HLD (hyperlipidemia)    Chronic, stable on atorvastatin - continue. LDL 70 at goal.  The 10-year ASCVD risk score (Arnett DK, et al., 2019) is: 22.8%   Values used to calculate the score:     Age: 58 years     Sex: Male     Is Non-Hispanic African American: No     Diabetic: No     Tobacco smoker: No     Systolic Blood Pressure: 128 mmHg     Is BP treated: Yes     HDL Cholesterol: 54.9 mg/dL     Total Cholesterol: 136 mg/dL       Relevant Medications   amLODipine (NORVASC) 5 MG tablet   atorvastatin (LIPITOR) 40 MG tablet   sildenafil (VIAGRA) 100 MG tablet   Prediabetes    Encouraged limiting added sugar in diet. Alcohol is a form of added sugar.       Habitual alcohol use    Discussed this. Encouraged cessation due to health risks of ongoing use. He is not interested in decreasing alcohol intake at this time.       Erectile dysfunction    Ongoing difficulty - will continue viagra.  Discussed alcohol possibly contributing to ED issues.      Relevant Medications   sildenafil (VIAGRA) 100 MG tablet   Vitamin B12 deficiency    Stable period on daily b12 replacement.       Peripheral neuropathy    Chronic, ongoing.  Continue b vitamin replacement.  Anticipate alcohol use contributes, ?hyperglycemia.       Essential hypertension    Chronic, stable. Continue amlodipine.       Relevant Medications   amLODipine (NORVASC) 5 MG tablet   atorvastatin (LIPITOR) 40 MG tablet   sildenafil (VIAGRA) 100 MG tablet   Thiamine deficiency    Continue b1 replacement.         Meds ordered this encounter  Medications   amLODipine (NORVASC) 5 MG tablet    Sig: Take 1 tablet (5 mg total) by mouth daily.    Dispense:  90 tablet    Refill:  4   atorvastatin (LIPITOR) 40 MG tablet    Sig: Take 1 tablet (40 mg total) by mouth daily.    Dispense:  90 tablet    Refill:  4   levothyroxine (SYNTHROID) 75  MCG tablet     Sig: Take 1 tablet (75 mcg total) by mouth daily.    Dispense:  90 tablet    Refill:  4   sildenafil (VIAGRA) 100 MG tablet    Sig: Take 0.5-1 tablets (50-100 mg total) by mouth daily as needed for erectile dysfunction.    Dispense:  10 tablet    Refill:  12    No orders of the defined types were placed in this encounter.   Patient Instructions  Try antihistamine (zyrtec) and if no better may try nasal steroid (flonase).  Bring [place] a copy of your living will  Good to see you today Return as needed or 1 year for next physical   Follow up plan: Return in about 1 year (around 11/06/2024) for annual exam, prior fasting for blood work, medicare wellness visit.  Eustaquio Boyden, MD

## 2023-11-07 NOTE — Assessment & Plan Note (Addendum)
Encouraged limiting added sugar in diet. Alcohol is a form of added sugar.

## 2023-11-07 NOTE — Assessment & Plan Note (Signed)
Ongoing difficulty - will continue viagra.  Discussed alcohol possibly contributing to ED issues.

## 2023-11-07 NOTE — Assessment & Plan Note (Signed)
Preventative protocols reviewed and updated unless pt declined. Discussed healthy diet and lifestyle.  

## 2023-11-07 NOTE — Assessment & Plan Note (Signed)
Chronic, ongoing.  Continue b vitamin replacement.  Anticipate alcohol use contributes, ?hyperglycemia.

## 2023-12-02 ENCOUNTER — Ambulatory Visit: Payer: Medicare HMO | Admitting: Family Medicine

## 2023-12-02 ENCOUNTER — Ambulatory Visit (INDEPENDENT_AMBULATORY_CARE_PROVIDER_SITE_OTHER): Payer: Medicare HMO | Admitting: General Practice

## 2023-12-02 ENCOUNTER — Encounter: Payer: Self-pay | Admitting: General Practice

## 2023-12-02 VITALS — BP 136/80 | HR 73 | Temp 98.1°F | Wt 171.0 lb

## 2023-12-02 DIAGNOSIS — J069 Acute upper respiratory infection, unspecified: Secondary | ICD-10-CM | POA: Insufficient documentation

## 2023-12-02 MED ORDER — AZITHROMYCIN 250 MG PO TABS: ORAL_TABLET | ORAL | 0 refills | Status: AC

## 2023-12-02 NOTE — Assessment & Plan Note (Addendum)
Given the presentation of patient and length of symptoms, will go ahead and treat for bacterial upper respiratory infection.  Start Azithromycin antibiotics for infection. Take 2 tablets by mouth today, then 1 tablet daily for 4 additional days. Rx sent.   Recommendations given for OTC medications for symptom management.   He will follow up if symptoms worse or do not improve.

## 2023-12-02 NOTE — Patient Instructions (Addendum)
Start Azithromycin 250 mg tablet 2 tablets on day one and 1 tablet from day 2-5.   You can try a few things over the counter to help with your symptoms including:  Cough: Delsym or Robitussin (get the off brand, works just as well) Chest Congestion: Mucinex (plain) Nasal Congestion/Ear Pressure/Sinus Pressure: Try using Flonase (fluticasone) nasal spray. Instill 1 spray in each nostril twice daily. This can be purchased over the counter. Body aches, fevers, headache: Ibuprofen (not to exceed 2400 mg in 24 hours) or Acetaminophen-Tylenol (not to exceed 3000 mg in 24 hours) Runny Nose/Throat Drainage/Sneezing/Itchy or Watery Eyes: An antihistamine such as Zyrtec, Claritin, Xyzal, Allegra  Schedule follow up with PCP if you are not feeling better or your symptoms worsen.   It was a pleasure meeting you!

## 2023-12-02 NOTE — Progress Notes (Signed)
Established Patient Office Visit  Subjective   Patient ID: Henry Baldwin., male    DOB: 14-Feb-1951  Age: 72 y.o. MRN: [id number]  Chief Complaint  Patient presents with   Cough    Congestion, weakness x 1 week. Taking mucinex; coughing up clear mucus. Has not had any testing done. Wife had flu and covid tests done but all negative.     Cough Pertinent negatives include no chest pain, chills, ear pain, fever, headaches, sore throat or shortness of breath.    [name]. Is a 72 year old male, patient of Dr. Sharen Hones, presents today for an acute visit.  His symptom onset was on 11/25/23 with runny nose, congestion and fatigue. He has noticed some hoarseness that comes and goes. He has been taking mucinex every 12 hours and has been able to cough up phlegm which was green but now is clear. He has been blowing his nose a lot. He feels very fatigue. No trouble sleeping. He denies any fever, chills, chest pain, dyspnea or shortness of breath. He denies any sinus pain and/or pressure. He is up to date on his influenza vaccine.   He was baby sitting his grand baby on 11/21/23 who had a runny nose. Then His wife got sick on 11/24/23 and he got sick on 11/25/23. Wife had covid and flu test which were both negative. Wife has been treated with doxycycline and has had no improvement.     Patient Active Problem List   Diagnosis Date Noted   Upper respiratory infection, acute 12/02/2023   Thiamine deficiency 10/22/2021   Essential hypertension 07/19/2019   Health maintenance examination 05/27/2017   Peripheral neuropathy 05/27/2017   Abnormal EKG 02/02/2017   Vitamin B12 deficiency 12/14/2016   Carotid stenosis, asymptomatic    Medicare annual wellness visit, subsequent 03/28/2015   Advanced care planning/counseling discussion 03/28/2015   Habitual alcohol use 03/28/2015   Erectile dysfunction 03/28/2015   Renal cyst, acquired, right 03/28/2015   Nephrolithiasis 02/27/2014    Hypothyroidism 05/25/2007   HLD (hyperlipidemia) 05/25/2007   Ex-smoker 05/25/2007   Spondylosis 05/25/2007   Prediabetes 05/25/2007   Past Medical History:  Diagnosis Date   Carotid stenosis, asymptomatic 2016   1-39% B, rpt 2 yrs   Cataract    Cyst of right kidney 2015   2cm, stable on rpt [place]   DJD (degenerative joint disease), lumbar    HLD (hyperlipidemia)    Borderline   HTN (hypertension)    Hypothyroid    Lyme disease 05/18/2013   Nephrolithiasis 02/2014   No Known Allergies       11/07/2023    2:28 PM 09/22/2023    2:08 PM 10/18/2022   10:05 AM  Depression screen PHQ 2/9  Decreased Interest 0 0 0  Down, Depressed, Hopeless 0 0 0  PHQ - 2 Score 0 0 0  Altered sleeping   0  Tired, decreased energy   0  Change in appetite   0  Feeling bad or failure about yourself    0  Trouble concentrating   0  Moving slowly or fidgety/restless   0  Suicidal thoughts   0  PHQ-9 Score   0  Difficult doing work/chores   Not difficult at all       11/07/2023    2:28 PM 10/18/2022   10:06 AM  GAD 7 : Generalized Anxiety Score  Nervous, Anxious, on Edge 0 0  Control/stop worrying 0 0  Worry too much -  different things 0 0  Trouble relaxing 0 0  Restless 0 0  Easily annoyed or irritable 0 0  Afraid - awful might happen 0 1  Total GAD 7 Score 0 1      Review of Systems  Constitutional:  Positive for malaise/fatigue. Negative for chills and fever.  HENT:  Positive for congestion and sinus pain. Negative for ear pain and sore throat.   Eyes:  Negative for blurred vision.  Respiratory:  Positive for cough. Negative for shortness of breath.   Cardiovascular:  Negative for chest pain.  Neurological:  Negative for headaches.      Objective:     BP 136/80 (BP Location: Left Arm, Patient Position: Sitting, Cuff Size: Normal)   Pulse 73   Temp 98.1 F (36.7 C) (Oral)   Wt 171 lb (77.6 kg)   SpO2 98%   BMI 26.58 kg/m  BP Readings from Last 3 Encounters:  12/02/23  136/80  11/07/23 128/70  10/18/22 (!) 150/72   Wt Readings from Last 3 Encounters:  12/02/23 171 lb (77.6 kg)  11/07/23 169 lb 4 oz (76.8 kg)  09/22/23 170 lb (77.1 kg)      Physical Exam Vitals and nursing note reviewed.  Constitutional:      Appearance: Normal appearance.  HENT:     Right Ear: Tympanic membrane, ear canal and external ear normal.     Left Ear: Tympanic membrane, ear canal and external ear normal.     Ears:     Comments: Hard of hearing. Wears hearing aids.    Nose: Congestion present.     Mouth/Throat:     Mouth: Mucous membranes are moist.     Pharynx: No posterior oropharyngeal erythema.  Cardiovascular:     Rate and Rhythm: Normal rate and regular rhythm.     Pulses: Normal pulses.     Heart sounds: Normal heart sounds.  Pulmonary:     Effort: Pulmonary effort is normal.     Breath sounds: Normal breath sounds.  Skin:    General: Skin is warm.  Neurological:     Mental Status: He is alert and oriented to person, place, and time.  Psychiatric:        Mood and Affect: Mood normal.        Behavior: Behavior normal.      No results found for any visits on 12/02/23.     The 10-year ASCVD risk score (Arnett DK, et al., 2019) is: 25.1%    Assessment & Plan:  Upper respiratory infection, acute Assessment & Plan: Given the presentation of patient and length of symptoms, will go ahead and treat for bacterial upper respiratory infection.  Start Azithromycin antibiotics for infection. Take 2 tablets by mouth today, then 1 tablet daily for 4 additional days. Rx sent.   Recommendations given for OTC medications for symptom management.   He will follow up if symptoms worse or do not improve.   Orders: -     Azithromycin; Take 2 tablets on day 1, then 1 tablet daily on days 2 through 5  Dispense: 6 tablet; Refill: 0     Return if symptoms worsen or fail to improve.    Modesto Charon, NP

## 2024-09-27 ENCOUNTER — Ambulatory Visit: Payer: Medicare HMO

## 2024-09-27 VITALS — BP 136/80 | Ht 68.0 in | Wt 162.0 lb

## 2024-09-27 DIAGNOSIS — Z0000 Encounter for general adult medical examination without abnormal findings: Secondary | ICD-10-CM | POA: Diagnosis not present

## 2024-09-27 NOTE — Patient Instructions (Signed)
 Henry Baldwin,  Thank you for taking the time for your Medicare Wellness Visit. I appreciate your continued commitment to your health goals. Please review the care plan we discussed, and feel free to reach out if I can assist you further.  Medicare recommends these wellness visits once per year to help you and your care team stay ahead of potential health issues. These visits are designed to focus on prevention, allowing your provider to concentrate on managing your acute and chronic conditions during your regular appointments.  Please note that Annual Wellness Visits do not include a physical exam. Some assessments may be limited, especially if the visit was conducted virtually. If needed, we may recommend a separate in-person follow-up with your provider.  Ongoing Care Seeing your primary care provider every 3 to 6 months helps us  monitor your health and provide consistent, personalized care.   Referrals If a referral was made during today's visit and you haven't received any updates within two weeks, please contact the referred provider directly to check on the status.  Recommended Screenings:  Health Maintenance  Topic Date Due   Zoster (Shingles) Vaccine (1 of 2) Never done   DTaP/Tdap/Td vaccine (3 - Td or Tdap) 03/31/2024   Flu Shot  07/30/2024   COVID-19 Vaccine (5 - 2025-26 season) 08/30/2024   Medicare Annual Wellness Visit  09/21/2024   Colon Cancer Screening  08/28/2027   Pneumococcal Vaccine for age over 36  Completed   Hepatitis C Screening  Completed   HPV Vaccine  Aged Out   Meningitis B Vaccine  Aged Out       09/27/2024    9:21 AM  Advanced Directives  Does Patient Have a Medical Advance Directive? Yes  Type of Estate agent of [place];Living will  Does patient want to make changes to medical advance directive? No - Patient declined  Copy of Healthcare Power of Attorney in Chart? No - copy requested   Advance Care Planning is important  because it: Ensures you receive medical care that aligns with your values, goals, and preferences. Provides guidance to your family and loved ones, reducing the emotional burden of decision-making during critical moments.  Vision: Annual vision screenings are recommended for early detection of glaucoma, cataracts, and diabetic retinopathy. These exams can also reveal signs of chronic conditions such as diabetes and high blood pressure.  Dental: Annual dental screenings help detect early signs of oral cancer, gum disease, and other conditions linked to overall health, including heart disease and diabetes.  Please see the attached documents for additional preventive care recommendations.

## 2024-09-27 NOTE — Progress Notes (Signed)
 Because this visit was a virtual/telehealth visit,  certain criteria was not obtained, such a blood pressure, CBG if applicable, and timed get up and go. Any medications not marked as taking were not mentioned during the medication reconciliation part of the visit. Any vitals not documented were not able to be obtained due to this being a telehealth visit or patient was unable to self-report a recent blood pressure reading due to a lack of equipment at home via telehealth. Vitals that have been documented are verbally provided by the patient.   This visit was performed by a medical professional under my direct supervision. I was immediately available for consultation/collaboration. I have reviewed and agree with the Annual Wellness Visit documentation.  Subjective:   Henry Baldwin. is a 73 y.o. who presents for a Medicare Wellness preventive visit.  As a reminder, Annual Wellness Visits don't include a physical exam, and some assessments may be limited, especially if this visit is performed virtually. We may recommend an in-person follow-up visit with your provider if needed.  Visit Complete: Virtual I connected with  Henry Baldwin. on 09/27/24 by a audio enabled telemedicine application and verified that I am speaking with the correct person using two identifiers.  Patient Location: Home  Provider Location: Home Office  I discussed the limitations of evaluation and management by telemedicine. The patient expressed understanding and agreed to proceed.  Vital Signs: Because this visit was a virtual/telehealth visit, some criteria may be missing or patient reported. Any vitals not documented were not able to be obtained and vitals that have been documented are patient reported.  VideoDeclined- This patient declined Librarian, academic. Therefore the visit was completed with audio only.  Persons Participating in Visit: Patient.  AWV Questionnaire: No: Patient  Medicare AWV questionnaire was not completed prior to this visit.  Cardiac Risk Factors include: advanced age (>15men, >76 women);male gender;dyslipidemia     Objective:    Today's Vitals   09/27/24 0916  BP: 136/80  Weight: 162 lb (73.5 kg)  Height: 5' 8 (1.727 m)   Body mass index is 24.63 kg/m.     09/27/2024    9:21 AM 09/22/2023    2:09 PM 07/19/2019    1:10 PM 07/13/2018    9:24 AM 05/21/2017    9:31 AM 05/15/2016    8:20 AM 02/06/2016   12:33 PM  Advanced Directives  Does Patient Have a Medical Advance Directive? Yes Yes Yes No  Yes  Yes  Yes   Type of Estate agent of [place];Living will Healthcare Power of [place];Living will Healthcare Power of [place];Living will  Living will Healthcare Power of [place];Living will  Healthcare Power of [place];Living will   Does patient want to make changes to medical advance directive? No - Patient declined  No - Patient declined    No - Patient declined    Copy of Healthcare Power of Attorney in Chart? No - copy requested No - copy requested No - copy requested    No - copy requested    Would patient like information on creating a medical advance directive?    No - Patient declined         Data saved with a previous flowsheet row definition    Current Medications (verified) Outpatient Encounter Medications as of 09/27/2024  Medication Sig   amLODipine  (NORVASC ) 5 MG tablet Take 1 tablet (5 mg total) by mouth daily.   aspirin 325 MG tablet Take 325 mg by  mouth once a week.   atorvastatin  (LIPITOR) 40 MG tablet Take 1 tablet (40 mg total) by mouth daily.   levothyroxine  (SYNTHROID ) 75 MCG tablet Take 1 tablet (75 mcg total) by mouth daily.   sildenafil  (VIAGRA ) 100 MG tablet Take 0.5-1 tablets (50-100 mg total) by mouth daily as needed for erectile dysfunction.   thiamine  (VITAMIN B-1) 50 MG tablet Take 1 tablet (50 mg total) by mouth daily.   vitamin B-12 1000 MCG tablet Take 1 tablet (1,000 mcg total) by  mouth every other day.   No facility-administered encounter medications on file as of 09/27/2024.    Allergies (verified) Patient has no known allergies.   History: Past Medical History:  Diagnosis Date   Carotid stenosis, asymptomatic 2016   1-39% B, rpt 2 yrs   Cataract    Cyst of right kidney 2015   2cm, stable on rpt US    DJD (degenerative joint disease), lumbar    HLD (hyperlipidemia)    Borderline   HTN (hypertension)    Hypothyroid    Lyme disease 05/18/2013   Nephrolithiasis 02/2014   Past Surgical History:  Procedure Laterality Date   COLONOSCOPY  10/28/01   with polypectomy   COLONOSCOPY  10/19/2004   Divertics; no polyps   COLONOSCOPY  11/20/10   Normal-Dr. Abran   COLONOSCOPY  01/2016   TA, mod diverticulosis, f/u 5 yrs Oletta)   FOREIGN BODY REMOVAL Left 2007   fifth finger   Family History  Problem Relation Age of Onset   Colon cancer Mother 17   Congestive Heart Failure Mother    Hypertension Father    Thyroid  disease Father    Thyroid  disease Sister    Alcohol abuse Brother    Heart disease Brother    Thyroid  disease Brother    Cancer Paternal Uncle        prostate   Colon polyps Neg Hx    Esophageal cancer Neg Hx    Stomach cancer Neg Hx    Rectal cancer Neg Hx    Social History   Socioeconomic History   Marital status: Married    Spouse name: Not on file   Number of children: 1   Years of education: Not on file   Highest education level: Not on file  Occupational History   Occupation: Research officer, political party: [employer]  Tobacco Use   Smoking status: Former    Current packs/day: 0.00    Types: Cigarettes    Quit date: 12/30/1998    Years since quitting: 25.7   Smokeless tobacco: Never  Vaping Use   Vaping status: Never Used  Substance and Sexual Activity   Alcohol use: Yes    Alcohol/week: 32.0 standard drinks of alcohol    Types: 32 Cans of beer per week   Drug use: No   Sexual activity: Yes  Other Topics Concern   Not  on file  Social History Narrative   Married, lives with wife, new cat   1 child   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[employer]   Activity: outdoor work   Diet: good water, vegetables daily   Social Drivers of Corporate investment banker Strain: Low Risk  (09/27/2024)   Overall Financial Resource Strain (CARDIA)    Difficulty of Paying Living Expenses: Not hard at all  Food Insecurity: No Food Insecurity (09/27/2024)   Hunger Vital Sign    Worried About Running Out of Food in the Last Year: Never true    Ran  Out of Food in the Last Year: Never true  Transportation Needs: No Transportation Needs (09/27/2024)   PRAPARE - Administrator, Civil Service (Medical): No    Lack of Transportation (Non-Medical): No  Physical Activity: Sufficiently Active (09/27/2024)   Exercise Vital Sign    Days of Exercise per Week: 7 days    Minutes of Exercise per Session: 60 min  Stress: No Stress Concern Present (09/27/2024)   [employer] of Occupational Health - Occupational Stress Questionnaire    Feeling of Stress: Only a little  Social Connections: Moderately Integrated (09/27/2024)   Social Connection and Isolation Panel    Frequency of Communication with Friends and Family: More than three times a week    Frequency of Social Gatherings with Friends and Family: More than three times a week    Attends Religious Services: Never    Database administrator or Organizations: Yes    Attends Engineer, structural: More than 4 times per year    Marital Status: Married    Tobacco Counseling Counseling given: Not Answered    Clinical Intake:  Pre-visit preparation completed: Yes  Pain : No/denies pain     BMI - recorded: 24.63 Nutritional Status: BMI of 19-24  Normal Nutritional Risks: None Diabetes: No  Lab Results  Component Value Date   HGBA1C 6.2 10/31/2023   HGBA1C 6.1 10/18/2022   HGBA1C 5.8 10/16/2021     How often do you need to have someone help you when you read  instructions, pamphlets, or other written materials from your doctor or pharmacy?: 1 - Never  Interpreter Needed?: No  Information entered by :: Daylen Lipsky,CMA   Activities of Daily Living     09/27/2024    9:20 AM  In your present state of health, do you have any difficulty performing the following activities:  Hearing? 1  Vision? 0  Difficulty concentrating or making decisions? 0  Walking or climbing stairs? 0  Dressing or bathing? 0  Doing errands, shopping? 0  Preparing Food and eating ? N  Using the Toilet? N  In the past six months, have you accidently leaked urine? N  Do you have problems with loss of bowel control? N  Managing your Medications? N  Managing your Finances? N  Housekeeping or managing your Housekeeping? N    Patient Care Team: Rilla Baller, MD as PCP - General (Family Medicine) Norleen Dilling, MS, CCC-A as Referring Physician (Audiology) Marinus Sera, MD as Referring Physician (Ophthalmology) Cathaleen Elna ORN., MD as Referring Physician (Dentistry)  I have updated your Care Teams any recent Medical Services you may have received from other providers in the past year.     Assessment:   This is a routine wellness examination for [place].  Hearing/Vision screen Hearing Screening - Comments:: Patient wears hearing aids  Vision Screening - Comments:: Patient wears glasses    Goals Addressed             This Visit's Progress    Patient Stated   On track    Continue with yard work daily       Depression Screen     09/27/2024    9:23 AM 11/07/2023    2:28 PM 09/22/2023    2:08 PM 10/18/2022   10:05 AM 10/16/2021    8:50 AM 07/21/2020    9:31 AM 07/19/2019    1:10 PM  PHQ 2/9 Scores  PHQ - 2 Score 0 0 0 0 0 0 0  PHQ- 9 Score 1   0   0    Fall Risk     09/27/2024    9:21 AM 11/07/2023    2:28 PM 09/22/2023    2:07 PM 10/18/2022    8:55 AM 10/16/2021    8:49 AM  Fall Risk   Falls in the past year? 0 0 0 0 0  Number falls  in past yr: 0  0    Injury with Fall? 0  0    Risk for fall due to : No Fall Risks  No Fall Risks    Follow up Falls evaluation completed  Falls prevention discussed      MEDICARE RISK AT HOME:  Medicare Risk at Home Any stairs in or around the home?: Yes If so, are there any without handrails?: No Home free of loose throw rugs in walkways, pet beds, electrical cords, etc?: Yes Adequate lighting in your home to reduce risk of falls?: Yes Life alert?: No Use of a cane, walker or w/c?: No Grab bars in the bathroom?: Yes Shower chair or bench in shower?: Yes Elevated toilet seat or a handicapped toilet?: Yes  TIMED UP AND GO:  Was the test performed?  No  Cognitive Function: 6CIT completed    07/19/2019    1:15 PM 07/13/2018    9:24 AM 05/21/2017    9:10 AM 05/15/2016    8:45 AM  MMSE - Mini Mental State Exam  Orientation to time 5 5 5  5    Orientation to Place 5 5 5  5    Registration 3 3 3  3    Attention/ Calculation 3 0 0  0   Recall 3 3 3  3    Language- name 2 objects 0 0 0  0   Language- repeat 1 1 1 1   Language- follow 3 step command 0 3 3  3    Language- read & follow direction 0 0 0  0   Write a sentence 0 0 0  0   Copy design 0 0 0  0   Total score 20 20 20  20       Data saved with a previous flowsheet row definition        09/27/2024    9:18 AM 09/22/2023    2:10 PM  6CIT Screen  What Year? 0 points 0 points  What month? 0 points 0 points  What time? 0 points 0 points  Count back from 20 0 points 0 points  Months in reverse 0 points 0 points  Repeat phrase 0 points 0 points  Total Score 0 points 0 points    Immunizations Immunization History  Administered Date(s) Administered   Fluad Quad(high Dose 65+) 10/18/2022   INFLUENZA, HIGH DOSE SEASONAL PF 09/11/2017, 09/07/2021, 10/02/2023   Influenza Whole 11/12/2005, 01/14/2013   Influenza,inj,Quad PF,6+ Mos 10/05/2014, 09/19/2016   Influenza-Unspecified 09/30/2013, 10/30/2018   PFIZER(Purple  Top)SARS-COV-2 Vaccination 01/20/2020, 02/10/2020, 12/04/2020   PNEUMOCOCCAL CONJUGATE-20 10/02/2023   Pfizer Covid-19 Vaccine Bivalent Booster 71yrs & up 09/07/2021   Pneumococcal Conjugate-13 03/28/2015   Pneumococcal Polysaccharide-23 05/22/2016   Td 09/03/2001   Tdap 03/31/2014    Screening Tests Health Maintenance  Topic Date Due   Zoster Vaccines- Shingrix (1 of 2) Never done   DTaP/Tdap/Td (3 - Td or Tdap) 03/31/2024   Influenza Vaccine  07/30/2024   COVID-19 Vaccine (5 - 2025-26 season) 08/30/2024   Medicare Annual Wellness (AWV)  09/27/2025   Colonoscopy  08/28/2027   Pneumococcal Vaccine:  50+ Years  Completed   Hepatitis C Screening  Completed   HPV VACCINES  Aged Out   Meningococcal B Vaccine  Aged Out    Health Maintenance Items Addressed:patient declined   Additional Screening:  Vision Screening: Recommended annual ophthalmology exams for early detection of glaucoma and other disorders of the eye. Is the patient up to date with their annual eye exam?  No  Who is the provider or what is the name of the office in which the patient attends annual eye exams?   Dental Screening: Recommended annual dental exams for proper oral hygiene  Community Resource Referral / Chronic Care Management: CRR required this visit?  No   CCM required this visit?  No   Plan:    I have personally reviewed and noted the following in the patient's chart:   Medical and social history Use of alcohol, tobacco or illicit drugs  Current medications and supplements including opioid prescriptions. Patient is not currently taking opioid prescriptions. Functional ability and status Nutritional status Physical activity Advanced directives List of other physicians Hospitalizations, surgeries, and ER visits in previous 12 months Vitals Screenings to include cognitive, depression, and falls Referrals and appointments  In addition, I have reviewed and discussed with patient certain  preventive protocols, quality metrics, and best practice recommendations. A written personalized care plan for preventive services as well as general preventive health recommendations were provided to patient.   Lyle MARLA Right, [place]   09/27/2024   After Visit Summary: (MyChart) Due to this being a telephonic visit, the after visit summary with patients personalized plan was offered to patient via MyChart   Notes: Nothing significant to report at this time.

## 2024-09-28 ENCOUNTER — Telehealth: Payer: Self-pay

## 2024-09-28 NOTE — Telephone Encounter (Signed)
 Copied from CRM #[id number]. Topic: Clinical - Request for Lab/Test Order >> Sep 28, 2024  2:47 PM Chiquita SQUIBB wrote: Reason for CRM: Patients wife is requesting lab work to be done prior to his physical on 11/25. Please advise patient if the orders are put in.

## 2024-09-28 NOTE — Telephone Encounter (Addendum)
 Please schedule lab visit 1 wk prior to physical and I will order labs clsoer to the date.

## 2024-10-06 NOTE — Progress Notes (Signed)
 Henry Baldwin.                                          MRN: [id number]   10/06/2024   The [facility] Quality Team Specialist reviewed this patient medical record for the purposes of chart review for care gap closure. The following were reviewed: chart review for care gap closure-controlling blood pressure.    [facility]I Quality Team

## 2024-11-13 ENCOUNTER — Other Ambulatory Visit: Payer: Self-pay | Admitting: Family Medicine

## 2024-11-13 DIAGNOSIS — E785 Hyperlipidemia, unspecified: Secondary | ICD-10-CM

## 2024-11-13 DIAGNOSIS — F1090 Alcohol use, unspecified, uncomplicated: Secondary | ICD-10-CM

## 2024-11-13 DIAGNOSIS — E039 Hypothyroidism, unspecified: Secondary | ICD-10-CM

## 2024-11-13 DIAGNOSIS — R7303 Prediabetes: Secondary | ICD-10-CM

## 2024-11-13 DIAGNOSIS — Z125 Encounter for screening for malignant neoplasm of prostate: Secondary | ICD-10-CM

## 2024-11-13 DIAGNOSIS — E538 Deficiency of other specified B group vitamins: Secondary | ICD-10-CM

## 2024-11-13 DIAGNOSIS — E519 Thiamine deficiency, unspecified: Secondary | ICD-10-CM

## 2024-11-16 ENCOUNTER — Other Ambulatory Visit

## 2024-11-16 DIAGNOSIS — E519 Thiamine deficiency, unspecified: Secondary | ICD-10-CM

## 2024-11-16 DIAGNOSIS — E785 Hyperlipidemia, unspecified: Secondary | ICD-10-CM | POA: Diagnosis not present

## 2024-11-16 DIAGNOSIS — E039 Hypothyroidism, unspecified: Secondary | ICD-10-CM

## 2024-11-16 DIAGNOSIS — R7303 Prediabetes: Secondary | ICD-10-CM | POA: Diagnosis not present

## 2024-11-16 DIAGNOSIS — Z125 Encounter for screening for malignant neoplasm of prostate: Secondary | ICD-10-CM

## 2024-11-16 DIAGNOSIS — F1090 Alcohol use, unspecified, uncomplicated: Secondary | ICD-10-CM

## 2024-11-16 DIAGNOSIS — E538 Deficiency of other specified B group vitamins: Secondary | ICD-10-CM | POA: Diagnosis not present

## 2024-11-16 LAB — CBC WITH DIFFERENTIAL/PLATELET
Basophils Absolute: 0 10*3/uL (ref 0.0–0.1)
Basophils Relative: 0.5 % (ref 0.0–3.0)
Eosinophils Absolute: 0.1 10*3/uL (ref 0.0–0.7)
Eosinophils Relative: 2.2 % (ref 0.0–5.0)
HCT: 40.2 % (ref 39.0–52.0)
Hemoglobin: 13.6 g/dL (ref 13.0–17.0)
Lymphocytes Relative: 25.2 % (ref 12.0–46.0)
Lymphs Abs: 1.4 10*3/uL (ref 0.7–4.0)
MCHC: 33.9 g/dL (ref 30.0–36.0)
MCV: 97.1 fl (ref 78.0–100.0)
Monocytes Absolute: 0.5 10*3/uL (ref 0.1–1.0)
Monocytes Relative: 8.4 % (ref 3.0–12.0)
Neutro Abs: 3.5 10*3/uL (ref 1.4–7.7)
Neutrophils Relative %: 63.7 % (ref 43.0–77.0)
Platelets: 261 10*3/uL (ref 150.0–400.0)
RBC: 4.13 Mil/uL — ABNORMAL LOW (ref 4.22–5.81)
RDW: 13.3 % (ref 11.5–15.5)
WBC: 5.5 10*3/uL (ref 4.0–10.5)

## 2024-11-16 LAB — COMPREHENSIVE METABOLIC PANEL WITH GFR
ALT: 10 U/L (ref 0–53)
AST: 15 U/L (ref 0–37)
Albumin: 4.1 g/dL (ref 3.5–5.2)
Alkaline Phosphatase: 64 U/L (ref 39–117)
BUN: 11 mg/dL (ref 6–23)
CO2: 29 meq/L (ref 19–32)
Calcium: 8.8 mg/dL (ref 8.4–10.5)
Chloride: 104 meq/L (ref 96–112)
Creatinine, Ser: 0.87 mg/dL (ref 0.40–1.50)
GFR: 84.57 mL/min
Glucose, Bld: 118 mg/dL — ABNORMAL HIGH (ref 70–99)
Potassium: 4.7 meq/L (ref 3.5–5.1)
Sodium: 139 meq/L (ref 135–145)
Total Bilirubin: 0.7 mg/dL (ref 0.2–1.2)
Total Protein: 6.5 g/dL (ref 6.0–8.3)

## 2024-11-16 LAB — LIPID PANEL
Cholesterol: 131 mg/dL (ref 0–200)
HDL: 64 mg/dL
LDL Cholesterol: 54 mg/dL (ref 0–99)
NonHDL: 66.51
Total CHOL/HDL Ratio: 2
Triglycerides: 64 mg/dL (ref 0.0–149.0)
VLDL: 12.8 mg/dL (ref 0.0–40.0)

## 2024-11-16 LAB — PSA: PSA: 0.55 ng/mL (ref 0.10–4.00)

## 2024-11-16 LAB — FOLATE: Folate: 13.9 ng/mL

## 2024-11-16 LAB — HEMOGLOBIN A1C: Hgb A1c MFr Bld: 6 % (ref 4.6–6.5)

## 2024-11-16 LAB — TSH: TSH: 3.07 u[IU]/mL (ref 0.35–5.50)

## 2024-11-16 LAB — VITAMIN B12: Vitamin B-12: 324 pg/mL (ref 211–911)

## 2024-11-17 ENCOUNTER — Ambulatory Visit: Payer: Self-pay | Admitting: Family Medicine

## 2024-11-17 ENCOUNTER — Encounter: Payer: Self-pay | Admitting: Family Medicine

## 2024-11-17 ENCOUNTER — Ambulatory Visit: Attending: Family Medicine

## 2024-11-17 ENCOUNTER — Ambulatory Visit (INDEPENDENT_AMBULATORY_CARE_PROVIDER_SITE_OTHER): Admitting: Family Medicine

## 2024-11-17 VITALS — BP 126/64 | HR 53 | Temp 98.6°F | Ht 68.0 in | Wt 164.0 lb

## 2024-11-17 DIAGNOSIS — I10 Essential (primary) hypertension: Secondary | ICD-10-CM | POA: Diagnosis not present

## 2024-11-17 DIAGNOSIS — F1090 Alcohol use, unspecified, uncomplicated: Secondary | ICD-10-CM

## 2024-11-17 DIAGNOSIS — R002 Palpitations: Secondary | ICD-10-CM | POA: Insufficient documentation

## 2024-11-17 DIAGNOSIS — E039 Hypothyroidism, unspecified: Secondary | ICD-10-CM

## 2024-11-17 NOTE — Assessment & Plan Note (Signed)
 Recent TSH normal - contiue levothyroxine  75mcg daily.

## 2024-11-17 NOTE — Assessment & Plan Note (Addendum)
 Clarified that what he is experiencing is heart palpitations/fluttering lasting seconds, without associated chest discomfort. He thinks these are tachypalpitations.  In long term habitual alcohol intake, discussed possible relation of this - he will cut down on his drinking.  EKG today reassuring but with bradycardia which is new.  Recent labs including TSH, CBC normal.  Will further evaluate with Zio event monitor  Will refer to cardiology for further evaluation.  Discussed possible echocardiogram as well pending zio patch.

## 2024-11-17 NOTE — Assessment & Plan Note (Signed)
 Chronic, BP well controlled on amlodipine  5mg  daily.

## 2024-11-17 NOTE — Assessment & Plan Note (Signed)
 Reviewed relation of alcohol use to heart disease ie dilated cardiomyopathy, electrical conduction disease.  He will cut down on alcohol intake.

## 2024-11-17 NOTE — Patient Instructions (Addendum)
 Cut down on alcohol  I will order heart monitor to wear for 2 weeks I will refer you to Dr End in [place] Let us  know if do develop chest discomfort /pain with these symptoms, or if worsening racing heart.  Hold viagra  for now.  Good to see you today

## 2024-11-17 NOTE — Progress Notes (Signed)
 Ph: [phone] Fax: [phone]   Patient ID: Henry Baldwin., male    DOB: 1951-04-28, 73 y.o.   MRN: [id number]  This visit was conducted in person.  BP 126/64   Pulse (!) 53   Temp 98.6 F (37 C) (Oral)   Ht 5' 8 (1.727 m)   Wt 164 lb (74.4 kg)   SpO2 97%   BMI 24.94 kg/m   BP Readings from Last 3 Encounters:  11/17/24 126/64  09/27/24 136/80  12/02/23 136/80    Pulse Readings from Last 3 Encounters:  11/17/24 (!) 53  12/02/23 73  11/07/23 70    CC: chest discomfort Subjective:   HPI: Henry Baldwin. is a 73 y.o. male presenting on 11/17/2024 for Chest Pain (X 3 weeks off and on)   3 wk h/o chest fluttering described as racing heartbeat without chest pain, pressure/tightness, sharp stabbing pain or reflux burning pain. Symptoms last 15-30 seconds. Pounding on chest can resolve the issue. Again clarifies no pain.  Randomly comes on, not exertional, not relieved by rest. He push mowed the lawn for 2 hours this morning - no symptoms during that time.   He is on full dose aspirin  325mg  once weekly, has recently increased to 3-4 times a week, as well as atorvastatin  40mg  daily.   Alcohol - Thurs-Sun 6 pack a day  Non smoker  He takes aleve  20mg  daily.      Relevant past medical, surgical, family and social history reviewed and updated as indicated. Interim medical history since our last visit reviewed. Allergies and medications reviewed and updated. Outpatient Medications Prior to Visit  Medication Sig Dispense Refill   amLODipine  (NORVASC ) 5 MG tablet Take 1 tablet (5 mg total) by mouth daily. 90 tablet 4   aspirin  325 MG tablet Take 325 mg by mouth once a week.     atorvastatin  (LIPITOR) 40 MG tablet Take 1 tablet (40 mg total) by mouth daily. 90 tablet 4   levothyroxine  (SYNTHROID ) 75 MCG tablet Take 1 tablet (75 mcg total) by mouth daily. 90 tablet 4   sildenafil  (VIAGRA ) 100 MG tablet Take 0.5-1 tablets (50-100 mg total) by mouth daily as needed  for erectile dysfunction. 10 tablet 12   thiamine  (VITAMIN B-1) 50 MG tablet Take 1 tablet (50 mg total) by mouth daily.     vitamin B-12 1000 MCG tablet Take 1 tablet (1,000 mcg total) by mouth every other day.     No facility-administered medications prior to visit.     Per HPI unless specifically indicated in ROS section below Review of Systems  Objective:  BP 126/64   Pulse (!) 53   Temp 98.6 F (37 C) (Oral)   Ht 5' 8 (1.727 m)   Wt 164 lb (74.4 kg)   SpO2 97%   BMI 24.94 kg/m   Wt Readings from Last 3 Encounters:  11/17/24 164 lb (74.4 kg)  09/27/24 162 lb (73.5 kg)  12/02/23 171 lb (77.6 kg)      Physical Exam Vitals and nursing note reviewed.  Constitutional:      Appearance: Normal appearance. He is not ill-appearing.  HENT:     Head: Normocephalic and atraumatic.  Eyes:     Extraocular Movements: Extraocular movements intact.     Conjunctiva/sclera: Conjunctivae normal.     Pupils: Pupils are equal, round, and reactive to light.  Neck:     Thyroid : No thyroid  mass or thyromegaly.  Cardiovascular:     Rate and  Rhythm: Normal rate and regular rhythm.     Pulses: Normal pulses.     Heart sounds: Normal heart sounds. No murmur heard. Pulmonary:     Effort: Pulmonary effort is normal. No respiratory distress.     Breath sounds: Normal breath sounds. No wheezing, rhonchi or rales.  Musculoskeletal:     Cervical back: Normal range of motion and neck supple.     Right lower leg: No edema.     Left lower leg: No edema.  Skin:    General: Skin is warm and dry.     Findings: No rash.  Neurological:     Mental Status: He is alert.  Psychiatric:        Mood and Affect: Mood normal.        Behavior: Behavior normal.       Results for orders placed or performed in visit on 11/16/24  CBC with Differential/Platelet   Collection Time: 11/16/24  7:51 AM  Result Value Ref Range   WBC 5.5 4.0 - 10.5 K/uL   RBC 4.13 (L) 4.22 - 5.81 Mil/uL   Hemoglobin 13.6 13.0  - 17.0 g/dL   HCT 59.7 60.9 - 47.9 %   MCV 97.1 78.0 - 100.0 fl   MCHC 33.9 30.0 - 36.0 g/dL   RDW 86.6 88.4 - 84.4 %   Platelets 261.0 150.0 - 400.0 K/uL   Neutrophils Relative % 63.7 43.0 - 77.0 %   Lymphocytes Relative 25.2 12.0 - 46.0 %   Monocytes Relative 8.4 3.0 - 12.0 %   Eosinophils Relative 2.2 0.0 - 5.0 %   Basophils Relative 0.5 0.0 - 3.0 %   Neutro Abs 3.5 1.4 - 7.7 K/uL   Lymphs Abs 1.4 0.7 - 4.0 K/uL   Monocytes Absolute 0.5 0.1 - 1.0 K/uL   Eosinophils Absolute 0.1 0.0 - 0.7 K/uL   Basophils Absolute 0.0 0.0 - 0.1 K/uL  Folate   Collection Time: 11/16/24  7:51 AM  Result Value Ref Range   Folate 13.9 >5.9 ng/mL  Vitamin B12   Collection Time: 11/16/24  7:51 AM  Result Value Ref Range   Vitamin B-12 324 211 - 911 pg/mL  Hemoglobin A1c   Collection Time: 11/16/24  7:51 AM  Result Value Ref Range   Hgb A1c MFr Bld 6.0 4.6 - 6.5 %  PSA   Collection Time: 11/16/24  7:51 AM  Result Value Ref Range   PSA 0.55 0.10 - 4.00 ng/mL  TSH   Collection Time: 11/16/24  7:51 AM  Result Value Ref Range   TSH 3.07 0.35 - 5.50 uIU/mL  Comprehensive metabolic panel with GFR   Collection Time: 11/16/24  7:51 AM  Result Value Ref Range   Sodium 139 135 - 145 mEq/L   Potassium 4.7 3.5 - 5.1 mEq/L   Chloride 104 96 - 112 mEq/L   CO2 29 19 - 32 mEq/L   Glucose, Bld 118 (H) 70 - 99 mg/dL   BUN 11 6 - 23 mg/dL   Creatinine, Ser 9.12 0.40 - 1.50 mg/dL   Total Bilirubin 0.7 0.2 - 1.2 mg/dL   Alkaline Phosphatase 64 39 - 117 U/L   AST 15 0 - 37 U/L   ALT 10 0 - 53 U/L   Total Protein 6.5 6.0 - 8.3 g/dL   Albumin 4.1 3.5 - 5.2 g/dL   GFR 15.42 >39.99 mL/min   Calcium  8.8 8.4 - 10.5 mg/dL  Lipid panel   Collection Time: 11/16/24  7:51  AM  Result Value Ref Range   Cholesterol 131 0 - 200 mg/dL   Triglycerides 35.9 0.0 - 149.0 mg/dL   HDL 35.99 >60.99 mg/dL   VLDL 87.1 0.0 - 59.9 mg/dL   LDL Cholesterol 54 0 - 99 mg/dL   Total CHOL/HDL Ratio 2    NonHDL 66.51    EKG -  sinus bradycardia rate 50s with normal axis, intervals, no hypertrophy or acute ST/T changes   Assessment & Plan:   Problem List Items Addressed This Visit     Acquired hypothyroidism   Recent TSH normal - contiue levothyroxine  75mcg daily.       Habitual alcohol use   Reviewed relation of alcohol use to heart disease ie dilated cardiomyopathy, electrical conduction disease.  He will cut down on alcohol intake.       Essential hypertension   Chronic, BP well controlled on amlodipine  5mg  daily.       Relevant Orders   Ambulatory referral to Cardiology   Palpitations - Primary   Clarified that what he is experiencing is heart palpitations/fluttering lasting seconds, without associated chest discomfort. He thinks these are tachypalpitations.  In long term habitual alcohol intake, discussed possible relation of this - he will cut down on his drinking.  EKG today reassuring but with bradycardia which is new.  Recent labs including TSH, CBC normal.  Will further evaluate with Zio event monitor  Will refer to cardiology for further evaluation.  Discussed possible echocardiogram as well pending zio patch.       Relevant Orders   EKG 12-Lead   LONG TERM MONITOR (3-14 DAYS)   Ambulatory referral to Cardiology     No orders of the defined types were placed in this encounter.   Orders Placed This Encounter  Procedures   Ambulatory referral to Cardiology    Referral Priority:   Routine    Referral Type:   Consultation    Referral Reason:   Specialty Services Required    Number of Visits Requested:   1   LONG TERM MONITOR (3-14 DAYS)    Standing Status:   Future    Number of Occurrences:   1    Expiration Date:   11/17/2025    Where should this test be performed?:   [facility]    Does the patient have an implanted cardiac device?:   No    Prescribed days of wear:   14    Type of enrollment:   Home Enrollment    Vendor::   [employer]    Reason for Exam:   Palpitations R00.2    EKG 12-Lead    Patient Instructions  Cut down on alcohol  I will order heart monitor to wear for 2 weeks I will refer you to Dr End in [place] Let us  know if do develop chest discomfort /pain with these symptoms, or if worsening racing heart.  Hold viagra  for now.  Good to see you today   Follow up plan: No follow-ups on file.  Anton Blas, MD

## 2024-11-20 LAB — VITAMIN B1: Vitamin B1 (Thiamine): 31 nmol/L — ABNORMAL HIGH (ref 8–30)

## 2024-11-23 ENCOUNTER — Encounter: Payer: Self-pay | Admitting: Family Medicine

## 2024-11-23 ENCOUNTER — Ambulatory Visit: Admitting: Family Medicine

## 2024-11-23 VITALS — BP 122/62 | HR 58 | Temp 98.9°F | Ht 68.0 in | Wt 164.5 lb

## 2024-11-23 DIAGNOSIS — B351 Tinea unguium: Secondary | ICD-10-CM

## 2024-11-23 DIAGNOSIS — I10 Essential (primary) hypertension: Secondary | ICD-10-CM | POA: Diagnosis not present

## 2024-11-23 DIAGNOSIS — F1090 Alcohol use, unspecified, uncomplicated: Secondary | ICD-10-CM | POA: Diagnosis not present

## 2024-11-23 DIAGNOSIS — E785 Hyperlipidemia, unspecified: Secondary | ICD-10-CM | POA: Diagnosis not present

## 2024-11-23 DIAGNOSIS — Z0000 Encounter for general adult medical examination without abnormal findings: Secondary | ICD-10-CM

## 2024-11-23 DIAGNOSIS — Z7189 Other specified counseling: Secondary | ICD-10-CM

## 2024-11-23 DIAGNOSIS — E039 Hypothyroidism, unspecified: Secondary | ICD-10-CM | POA: Diagnosis not present

## 2024-11-23 DIAGNOSIS — E538 Deficiency of other specified B group vitamins: Secondary | ICD-10-CM | POA: Diagnosis not present

## 2024-11-23 DIAGNOSIS — R002 Palpitations: Secondary | ICD-10-CM | POA: Diagnosis not present

## 2024-11-23 DIAGNOSIS — R7303 Prediabetes: Secondary | ICD-10-CM

## 2024-11-23 DIAGNOSIS — I6523 Occlusion and stenosis of bilateral carotid arteries: Secondary | ICD-10-CM | POA: Diagnosis not present

## 2024-11-23 DIAGNOSIS — E519 Thiamine deficiency, unspecified: Secondary | ICD-10-CM

## 2024-11-23 MED ORDER — ASPIRIN 325 MG PO TABS: 325.0000 mg | ORAL_TABLET | Freq: Every day | ORAL | Status: AC | PRN

## 2024-11-23 MED ORDER — CYANOCOBALAMIN 1000 MCG PO TABS: 1000.0000 ug | ORAL_TABLET | Freq: Every day | ORAL | Status: AC

## 2024-11-23 MED ORDER — NAPROXEN SODIUM 220 MG PO TABS: 220.0000 mg | ORAL_TABLET | Freq: Every day | ORAL | Status: AC

## 2024-11-23 MED ORDER — TERBINAFINE HCL 250 MG PO TABS: 250.0000 mg | ORAL_TABLET | Freq: Every day | ORAL | 0 refills | Status: DC

## 2024-11-23 MED ORDER — LEVOTHYROXINE SODIUM 75 MCG PO TABS: 75.0000 ug | ORAL_TABLET | Freq: Every day | ORAL | 3 refills | Status: AC

## 2024-11-23 MED ORDER — VITAMIN B-1 50 MG PO TABS: 50.0000 mg | ORAL_TABLET | ORAL | Status: AC

## 2024-11-23 MED ORDER — ATORVASTATIN CALCIUM 40 MG PO TABS: 40.0000 mg | ORAL_TABLET | Freq: Every day | ORAL | 3 refills | Status: AC

## 2024-11-23 MED ORDER — AMLODIPINE BESYLATE 5 MG PO TABS: 5.0000 mg | ORAL_TABLET | Freq: Every day | ORAL | 3 refills | Status: AC

## 2024-11-23 NOTE — Assessment & Plan Note (Signed)
 Preventative protocols reviewed and updated unless pt declined. Discussed healthy diet and lifestyle.

## 2024-11-23 NOTE — Assessment & Plan Note (Signed)
 Will continue to monitor. L [place] stenosis on latest carotid US  2022.

## 2024-11-23 NOTE — Progress Notes (Unsigned)
 Ph: [phone] Fax: [phone]   Patient ID: Henry Baldwin., male    DOB: 1951/04/20, 73 y.o.   MRN: [id number]  This visit was conducted in person.  BP 122/62   Pulse (!) 58   Temp 98.9 F (37.2 C) (Oral)   Ht 5' 8 (1.727 m)   Wt 164 lb 8 oz (74.6 kg)   SpO2 97%   BMI 25.01 kg/m    CC: CPE  Subjective:   HPI: Henry Baldwin. is a 73 y.o. male presenting on 11/23/2024 for Annual Exam (Pt needs help putting on the heart monitor)   Saw health advisor 08/2024 for medicare wellness visit. Note reviewed.   No results found.  Flowsheet Row Office Visit from 11/23/2024 in [facility] [place]  PHQ-2 Total Score 0       11/23/2024    3:59 PM 11/17/2024    1:09 PM 09/27/2024    9:21 AM 11/07/2023    2:28 PM 09/22/2023    2:07 PM  Fall Risk   Falls in the past year? 0 0 0 0 0  Number falls in past yr: 0 0 0  0  Injury with Fall? 0 0 0  0  Risk for fall due to : No Fall Risks No Fall Risks No Fall Risks  No Fall Risks  Follow up Falls evaluation completed Falls evaluation completed Falls evaluation completed  Falls prevention discussed   See prior note for details.  Weeks of chest fluttering/heart racing without chest pain.  Referred to cardiology - appt scheduled next week.  Ordered Zio patch - he brings in today requesting assistance to get it placed.   Fungal infection to toes treating with tree oil foot soak with improvement   Preventative: Colonoscopy 07/2022 - mod diverticulosis, int hem, no f/u needed Oletta) Prostate cancer screening - yearly PSA. Nocturia x1, occasional dribble.  Lung cancer screening - quit 20 yrs ago  Flu shot - yearly COVID vaccine - Pfizer 01/2020, booster 11/2020, bivalent 08/2021 Tdap - 03/2014 Prevnar-13 2016, pneumovax 2017, prevnar-20 09/2023 Shingles shot - discussed, declines.  Advanced directive discussion - has at home, brought copy today which will be reviewed. HCPOA is wife.  Seat belt use  discussed Sunscreen use discussed. No changing moles on skin.  Ex smoker - quit 2000  Alcohol - heavy drinker on weekends (6 pack a day Thurs through Sunday) - has started cutting down in the past few weeks Dentist Q6 mo  Eye exam - yearly - watching cataracts  Bowel - no constipation  Bladder - no incontinence   Married, lives with wife 1 child  Occ: [employer], retired, now cares for a few lawns.  Activity: outdoor yardwork, splits wood  Diet: good water, some low cal gatorade, vegetables daily      Relevant past medical, surgical, family and social history reviewed and updated as indicated. Interim medical history since our last visit reviewed. Allergies and medications reviewed and updated. Outpatient Medications Prior to Visit  Medication Sig Dispense Refill   sildenafil  (VIAGRA ) 100 MG tablet Take 0.5-1 tablets (50-100 mg total) by mouth daily as needed for erectile dysfunction. 10 tablet 12   amLODipine  (NORVASC ) 5 MG tablet Take 1 tablet (5 mg total) by mouth daily. 90 tablet 4   aspirin  325 MG tablet Take 325 mg by mouth once a week.     atorvastatin  (LIPITOR) 40 MG tablet Take 1 tablet (40 mg total) by mouth daily.  90 tablet 4   levothyroxine  (SYNTHROID ) 75 MCG tablet Take 1 tablet (75 mcg total) by mouth daily. 90 tablet 4   thiamine  (VITAMIN B-1) 50 MG tablet Take 1 tablet (50 mg total) by mouth daily.     vitamin B-12 1000 MCG tablet Take 1 tablet (1,000 mcg total) by mouth every other day.     No facility-administered medications prior to visit.     Per HPI unless specifically indicated in ROS section below Review of Systems  Constitutional:  Positive for appetite change (decreased). Negative for activity change, chills, fatigue, fever and unexpected weight change.  HENT:  Negative for hearing loss.   Eyes:  Negative for visual disturbance.  Respiratory:  Negative for cough, chest tightness, shortness of breath and wheezing.   Cardiovascular:   Positive for palpitations. Negative for chest pain and leg swelling.  Gastrointestinal:  Negative for abdominal distention, abdominal pain, blood in stool, constipation, diarrhea, nausea and vomiting.  Genitourinary:  Negative for difficulty urinating and hematuria.  Musculoskeletal:  Negative for arthralgias, myalgias and neck pain.  Skin:  Negative for rash.  Neurological:  Negative for dizziness, seizures, syncope and headaches.  Hematological:  Negative for adenopathy. Does not bruise/bleed easily.  Psychiatric/Behavioral:  Negative for dysphoric mood. The patient is not nervous/anxious.     Objective:  BP 122/62   Pulse (!) 58   Temp 98.9 F (37.2 C) (Oral)   Ht 5' 8 (1.727 m)   Wt 164 lb 8 oz (74.6 kg)   SpO2 97%   BMI 25.01 kg/m   Wt Readings from Last 3 Encounters:  11/23/24 164 lb 8 oz (74.6 kg)  11/17/24 164 lb (74.4 kg)  09/27/24 162 lb (73.5 kg)      Physical Exam Vitals and nursing note reviewed.  Constitutional:      General: He is not in acute distress.    Appearance: Normal appearance. He is well-developed. He is not ill-appearing.  HENT:     Head: Normocephalic and atraumatic.     Right Ear: Hearing and external ear normal.     Left Ear: Hearing and external ear normal.     Ears:     Comments: Wearing hearing aides    Mouth/Throat:     Mouth: Mucous membranes are moist.     Pharynx: Oropharynx is clear. No oropharyngeal exudate or posterior oropharyngeal erythema.  Eyes:     General: No scleral icterus.    Extraocular Movements: Extraocular movements intact.     Conjunctiva/sclera: Conjunctivae normal.     Pupils: Pupils are equal, round, and reactive to light.  Neck:     Thyroid : No thyroid  mass or thyromegaly.     Vascular: Carotid bruit (left) present.  Cardiovascular:     Rate and Rhythm: Normal rate and regular rhythm.     Pulses: Normal pulses.          Radial pulses are 2+ on the right side and 2+ on the left side.     Heart sounds: Normal  heart sounds. No murmur heard. Pulmonary:     Effort: Pulmonary effort is normal. No respiratory distress.     Breath sounds: Normal breath sounds. No wheezing, rhonchi or rales.  Abdominal:     General: Bowel sounds are normal. There is no distension.     Palpations: Abdomen is soft. There is no mass.     Tenderness: There is no abdominal tenderness. There is no guarding or rebound.     Hernia: No hernia  is present.  Musculoskeletal:        General: Normal range of motion.     Cervical back: Normal range of motion and neck supple.     Right lower leg: No edema.     Left lower leg: No edema.  Lymphadenopathy:     Cervical: No cervical adenopathy.  Skin:    General: Skin is warm and dry.     Findings: No rash.     Comments:  Thickened onychomycotic toenails bilaterally  Neurological:     General: No focal deficit present.     Mental Status: He is alert and oriented to person, place, and time.  Psychiatric:        Mood and Affect: Mood normal.        Behavior: Behavior normal.        Thought Content: Thought content normal.        Judgment: Judgment normal.       Results for orders placed or performed in visit on 11/16/24  CBC with Differential/Platelet   Collection Time: 11/16/24  7:51 AM  Result Value Ref Range   WBC 5.5 4.0 - 10.5 K/uL   RBC 4.13 (L) 4.22 - 5.81 Mil/uL   Hemoglobin 13.6 13.0 - 17.0 g/dL   HCT 59.7 60.9 - 47.9 %   MCV 97.1 78.0 - 100.0 fl   MCHC 33.9 30.0 - 36.0 g/dL   RDW 86.6 88.4 - 84.4 %   Platelets 261.0 150.0 - 400.0 K/uL   Neutrophils Relative % 63.7 43.0 - 77.0 %   Lymphocytes Relative 25.2 12.0 - 46.0 %   Monocytes Relative 8.4 3.0 - 12.0 %   Eosinophils Relative 2.2 0.0 - 5.0 %   Basophils Relative 0.5 0.0 - 3.0 %   Neutro Abs 3.5 1.4 - 7.7 K/uL   Lymphs Abs 1.4 0.7 - 4.0 K/uL   Monocytes Absolute 0.5 0.1 - 1.0 K/uL   Eosinophils Absolute 0.1 0.0 - 0.7 K/uL   Basophils Absolute 0.0 0.0 - 0.1 K/uL  Folate   Collection Time: 11/16/24   7:51 AM  Result Value Ref Range   Folate 13.9 >5.9 ng/mL  Vitamin B1   Collection Time: 11/16/24  7:51 AM  Result Value Ref Range   Vitamin B1 (Thiamine ) 31 (H) 8 - 30 nmol/L  Vitamin B12   Collection Time: 11/16/24  7:51 AM  Result Value Ref Range   Vitamin B-12 324 211 - 911 pg/mL  Hemoglobin A1c   Collection Time: 11/16/24  7:51 AM  Result Value Ref Range   Hgb A1c MFr Bld 6.0 4.6 - 6.5 %  PSA   Collection Time: 11/16/24  7:51 AM  Result Value Ref Range   PSA 0.55 0.10 - 4.00 ng/mL  TSH   Collection Time: 11/16/24  7:51 AM  Result Value Ref Range   TSH 3.07 0.35 - 5.50 uIU/mL  Comprehensive metabolic panel with GFR   Collection Time: 11/16/24  7:51 AM  Result Value Ref Range   Sodium 139 135 - 145 mEq/L   Potassium 4.7 3.5 - 5.1 mEq/L   Chloride 104 96 - 112 mEq/L   CO2 29 19 - 32 mEq/L   Glucose, Bld 118 (H) 70 - 99 mg/dL   BUN 11 6 - 23 mg/dL   Creatinine, Ser 9.12 0.40 - 1.50 mg/dL   Total Bilirubin 0.7 0.2 - 1.2 mg/dL   Alkaline Phosphatase 64 39 - 117 U/L   AST 15 0 - 37 U/L  ALT 10 0 - 53 U/L   Total Protein 6.5 6.0 - 8.3 g/dL   Albumin 4.1 3.5 - 5.2 g/dL   GFR 15.42 >39.99 mL/min   Calcium  8.8 8.4 - 10.5 mg/dL  Lipid panel   Collection Time: 11/16/24  7:51 AM  Result Value Ref Range   Cholesterol 131 0 - 200 mg/dL   Triglycerides 35.9 0.0 - 149.0 mg/dL   HDL 35.99 >60.99 mg/dL   VLDL 87.1 0.0 - 59.9 mg/dL   LDL Cholesterol 54 0 - 99 mg/dL   Total CHOL/HDL Ratio 2    NonHDL 66.51     Assessment & Plan:   Problem List Items Addressed This Visit     Advanced care planning/counseling discussion (Chronic)   Brought in today - will review.       Health maintenance examination - Primary (Chronic)   Preventative protocols reviewed and updated unless pt declined. Discussed healthy diet and lifestyle.       HLD (hyperlipidemia)   Relevant Medications   amLODipine  (NORVASC ) 5 MG tablet   atorvastatin  (LIPITOR) 40 MG tablet   aspirin  325 MG tablet    Carotid stenosis, asymptomatic   Will continue to monitor. L [place] stenosis on latest carotid US  2022.       Relevant Medications   amLODipine  (NORVASC ) 5 MG tablet   atorvastatin  (LIPITOR) 40 MG tablet   aspirin  325 MG tablet   Essential hypertension   Relevant Medications   amLODipine  (NORVASC ) 5 MG tablet   atorvastatin  (LIPITOR) 40 MG tablet   aspirin  325 MG tablet   Other Visit Diagnoses       Hypothyroidism, unspecified type       Relevant Medications   levothyroxine  (SYNTHROID ) 75 MCG tablet        Meds ordered this encounter  Medications   amLODipine  (NORVASC ) 5 MG tablet    Sig: Take 1 tablet (5 mg total) by mouth daily.    Dispense:  90 tablet    Refill:  3   atorvastatin  (LIPITOR) 40 MG tablet    Sig: Take 1 tablet (40 mg total) by mouth daily.    Dispense:  90 tablet    Refill:  3   levothyroxine  (SYNTHROID ) 75 MCG tablet    Sig: Take 1 tablet (75 mcg total) by mouth daily.    Dispense:  90 tablet    Refill:  3   aspirin  325 MG tablet    Sig: Take 1 tablet (325 mg total) by mouth daily as needed for headache.   naproxen  sodium (ALEVE ) 220 MG tablet    Sig: Take 1 tablet (220 mg total) by mouth daily.   cyanocobalamin  1000 MCG tablet    Sig: Take 1 tablet (1,000 mcg total) by mouth daily.   thiamine  (VITAMIN B-1) 50 MG tablet    Sig: Take 1 tablet (50 mg total) by mouth every Monday, Wednesday, and Friday.   terbinafine  (LAMISIL ) 250 MG tablet    Sig: Take 1 tablet (250 mg total) by mouth daily. For 6 weeks    Dispense:  45 tablet    Refill:  0    No orders of the defined types were placed in this encounter.   Patient Instructions  Drop B1 thiamine  vitamin to MWF dosing, continue b12 vitamin daily.  Wear zio patch for 2 weeks then return.  Good to see you today  Return as needed or in 1 year for next physical   Follow up plan: Return in about 1  year (around 11/23/2025) for medicare wellness visit, annual exam, prior fasting for blood  work.  Anton Blas, MD

## 2024-11-23 NOTE — Assessment & Plan Note (Signed)
 Brought in today - will review.

## 2024-11-23 NOTE — Patient Instructions (Addendum)
 Drop B1 thiamine  vitamin to MWF dosing, continue b12 vitamin daily.  Wear zio patch for 2 weeks then return.  Good to see you today  Return as needed or in 1 year for next physical

## 2024-11-24 DIAGNOSIS — B351 Tinea unguium: Secondary | ICD-10-CM | POA: Insufficient documentation

## 2024-11-24 NOTE — Assessment & Plan Note (Addendum)
 Zio patch monitor applied to chest following instructions on package.  He will wear for 2 wks.  Will also await cardiology eval.

## 2024-11-24 NOTE — Assessment & Plan Note (Signed)
 Discussed topical vs oral treatment - he requests oral treatment. Rx terbinafine  250mg  daily x6wks, check LFTs if 2nd course necessary.

## 2024-11-24 NOTE — Assessment & Plan Note (Signed)
 -  Continue levothyroxine 75 mcg daily

## 2024-11-24 NOTE — Assessment & Plan Note (Signed)
 Continue replacement

## 2024-11-24 NOTE — Assessment & Plan Note (Signed)
 Again reviewed alcohol use, encouraged slow cessation.  He has started cutting down.

## 2024-11-24 NOTE — Assessment & Plan Note (Addendum)
 Chronic, great control - continue  amlodipine 

## 2024-11-24 NOTE — Assessment & Plan Note (Signed)
 Continue to encourage limiting added sugar in diet.

## 2024-11-24 NOTE — Assessment & Plan Note (Signed)
 Chronic, well controlled on atorva 40mg  - continue. The 10-year ASCVD risk score (Arnett DK, et al., 2019) is: 21.5%   Values used to calculate the score:     Age: 73 years     Clincally relevant sex: Male     Is Non-Hispanic African American: No     Diabetic: No     Tobacco smoker: No     Systolic Blood Pressure: 122 mmHg     Is BP treated: Yes     HDL Cholesterol: 64 mg/dL     Total Cholesterol: 131 mg/dL

## 2024-11-28 NOTE — Progress Notes (Unsigned)
 Cardiology Office Note  Date:  11/29/2024   ID:  Henry Wisler., DOB 1951-02-22, MRN [id number]  PCP:  Rilla Baller, MD   Chief Complaint  Patient presents with   New Patient (Initial Visit)    Referred by Dr. Rilla for palpitations. Patient is currently wearing a Zio monitor that was placed by Dr. Rilla office last Tuesday. Patient c/o fluttering in chest that last 15-30 seconds.     HPI:  Henry Wieck. is a 73 y.o. male with past medical history of: Past Medical History:  Diagnosis Date   Carotid stenosis, asymptomatic 2016   1-39% B, rpt 2 yrs   Cataract    Cyst of right kidney 2015   2cm, stable on rpt US    DJD (degenerative joint disease), lumbar    HLD (hyperlipidemia)    Borderline   HTN (hypertension)    Hypothyroid    Lyme disease 05/18/2013   Nephrolithiasis 02/2014  Who presents by referral from Dr. Rilla for palpitations, hypertension  In follow-up today reports having cardiac arrhythmia for the past 4 weeks Pounds and flutters 15-20 sec Daily on avg Weekend was okay, had one today No  rhythm or reason No dizziness, no near-syncope or syncope Still active, yard work Several days with more than one epsiodes, on avg 1 a day Not sleeping well  Wife thinks it is stress related out of concern for her She had a fall 4 weeks ago  Alcohol - Thurs-Sun 6 pack a day  Non smoker   EKG personally reviewed by myself on todays visit EKG Interpretation Date/Time:  Monday November 29 2024 10:05:09 EST Ventricular Rate:  55 PR Interval:  158 QRS Duration:  86 QT Interval:  426 QTC Calculation: 407 R Axis:   13  Text Interpretation: Sinus bradycardia No previous ECGs available Confirmed by Perla Lye [phone]) on 11/29/2024 10:14:45 AM    PMH:   has a past medical history of Carotid stenosis, asymptomatic (2016), Cataract, Cyst of right kidney (2015), DJD (degenerative joint disease), lumbar, HLD (hyperlipidemia), HTN (hypertension),  Hypothyroid, Lyme disease (05/18/2013), and Nephrolithiasis (02/2014).   PSH:    Past Surgical History:  Procedure Laterality Date   COLONOSCOPY  10/28/01   with polypectomy   COLONOSCOPY  10/19/2004   Divertics; no polyps   COLONOSCOPY  11/20/10   Normal-Dr. Abran   COLONOSCOPY  01/2016   TA, mod diverticulosis, f/u 5 yrs Oletta)   FOREIGN BODY REMOVAL Left 2007   fifth finger    Current Outpatient Medications  Medication Sig Dispense Refill   amLODipine  (NORVASC ) 5 MG tablet Take 1 tablet (5 mg total) by mouth daily. 90 tablet 3   aspirin  325 MG tablet Take 1 tablet (325 mg total) by mouth daily as needed for headache.     atorvastatin  (LIPITOR) 40 MG tablet Take 1 tablet (40 mg total) by mouth daily. 90 tablet 3   cyanocobalamin  1000 MCG tablet Take 1 tablet (1,000 mcg total) by mouth daily.     levothyroxine  (SYNTHROID ) 75 MCG tablet Take 1 tablet (75 mcg total) by mouth daily. 90 tablet 3   naproxen  sodium (ALEVE ) 220 MG tablet Take 1 tablet (220 mg total) by mouth daily.     sildenafil  (VIAGRA ) 100 MG tablet Take 0.5-1 tablets (50-100 mg total) by mouth daily as needed for erectile dysfunction. 10 tablet 12   terbinafine  (LAMISIL ) 250 MG tablet Take 1 tablet (250 mg total) by mouth daily. For 6 weeks 45 tablet 0  thiamine  (VITAMIN B-1) 50 MG tablet Take 1 tablet (50 mg total) by mouth every Monday, Wednesday, and Friday.     No current facility-administered medications for this visit.     Allergies:   Patient has no known allergies.   Social History:  The patient  reports that he quit smoking about 25 years ago. His smoking use included cigarettes. He has never used smokeless tobacco. He reports current alcohol use of about 32.0 standard drinks of alcohol per week. He reports that he does not use drugs.   Family History:   family history includes Alcohol abuse in his brother; Cancer in his paternal uncle; Colon cancer (age of onset: 6) in his mother; Congestive Heart  Failure in his mother; Heart disease in his brother; Hypertension in his father; Thyroid  disease in his brother, father, and sister.    Review of Systems: Review of Systems  Constitutional: Negative.   HENT: Negative.    Respiratory: Negative.    Cardiovascular:  Positive for palpitations.  Gastrointestinal: Negative.   Musculoskeletal: Negative.   Neurological: Negative.   Psychiatric/Behavioral: Negative.    All other systems reviewed and are negative.   PHYSICAL EXAM: VS:  BP 130/60 (BP Location: Right Arm, Patient Position: Sitting, Cuff Size: Normal)   Pulse (!) 55   Ht 5' 8 (1.727 m)   Wt 163 lb 4 oz (74 kg)   SpO2 94%   BMI 24.82 kg/m  , BMI Body mass index is 24.82 kg/m. GEN: Well nourished, well developed, in no acute distress HEENT: normal Neck: no JVD, left carotid bruits 1+, none on right, no masses Cardiac: RRR; no murmurs, rubs, or gallops,no edema  Respiratory:  clear to auscultation bilaterally, normal work of breathing GI: soft, nontender, nondistended, + BS MS: no deformity or atrophy Skin: warm and dry, no rash Neuro:  Strength and sensation are intact Psych: euthymic mood, full affect  Recent Labs: 11/16/2024: ALT 10; BUN 11; Creatinine, Ser 0.87; Hemoglobin 13.6; Platelets 261.0; Potassium 4.7; Sodium 139; TSH 3.07    Lipid Panel Lab Results  Component Value Date   CHOL 131 11/16/2024   HDL 64.00 11/16/2024   LDLCALC 54 11/16/2024   TRIG 64.0 11/16/2024      Wt Readings from Last 3 Encounters:  11/29/24 163 lb 4 oz (74 kg)  11/23/24 164 lb 8 oz (74.6 kg)  11/17/24 164 lb (74.4 kg)     ASSESSMENT AND PLAN:  Problem List Items Addressed This Visit       Cardiology Problems   HLD (hyperlipidemia)   Essential hypertension   Relevant Orders   EKG 12-Lead (Completed)   Carotid stenosis, asymptomatic     Other   Prediabetes   Palpitations - Primary   Relevant Orders   EKG 12-Lead (Completed)   Other Visit Diagnoses       Chest  pain of uncertain etiology          Palpitations Etiology unclear, monitor in place has 1 more week to go before sending it in in 8 days -Recommend we review the results when they are available Would likely be a poor candidate for beta-blocker given heart rate currently 55 Unable to exclude atrial versus ventricular arrhythmia Otherwise normal EKG, normal clinical exam, active at baseline, no strong indication for echo at this time unless VT present on monitor  Hyperlipidemia Cholesterol is at goal on the current lipid regimen. No changes to the medications were made.  Essential hypertension Blood pressure is well controlled on  today's visit. No changes made to the medications.  Carotid stenosis Mild plaque noted 2018 and 2022 with bruit on the left, chronic finding per the patient Will hold off on additional testing Cholesterol at goal   Signed, Velinda Lunger, M.D., Ph.D. [facility] [place], [place] [phone]

## 2024-11-29 ENCOUNTER — Ambulatory Visit: Attending: Cardiovascular Disease | Admitting: Cardiovascular Disease

## 2024-11-29 ENCOUNTER — Encounter: Payer: Self-pay | Admitting: Cardiovascular Disease

## 2024-11-29 VITALS — BP 130/60 | HR 55 | Ht 68.0 in | Wt 163.2 lb

## 2024-11-29 DIAGNOSIS — E782 Mixed hyperlipidemia: Secondary | ICD-10-CM

## 2024-11-29 DIAGNOSIS — R002 Palpitations: Secondary | ICD-10-CM

## 2024-11-29 DIAGNOSIS — I10 Essential (primary) hypertension: Secondary | ICD-10-CM | POA: Diagnosis not present

## 2024-11-29 DIAGNOSIS — R079 Chest pain, unspecified: Secondary | ICD-10-CM

## 2024-11-29 DIAGNOSIS — I6523 Occlusion and stenosis of bilateral carotid arteries: Secondary | ICD-10-CM | POA: Diagnosis not present

## 2024-11-29 DIAGNOSIS — R7303 Prediabetes: Secondary | ICD-10-CM | POA: Diagnosis not present

## 2024-11-29 NOTE — Patient Instructions (Signed)

## 2024-12-14 DIAGNOSIS — R002 Palpitations: Secondary | ICD-10-CM | POA: Diagnosis not present

## 2024-12-26 DIAGNOSIS — R002 Palpitations: Secondary | ICD-10-CM | POA: Diagnosis not present

## 2025-01-01 ENCOUNTER — Ambulatory Visit: Payer: Self-pay | Admitting: Family Medicine

## 2025-01-13 ENCOUNTER — Other Ambulatory Visit: Payer: Self-pay | Admitting: Family Medicine

## 2025-01-15 NOTE — Telephone Encounter (Signed)
 Denied. Would need LFTs checked if refill needed. Advised to contact office.

## 2025-01-18 ENCOUNTER — Other Ambulatory Visit: Payer: Self-pay | Admitting: Family Medicine

## 2025-01-18 DIAGNOSIS — B351 Tinea unguium: Secondary | ICD-10-CM

## 2025-01-19 NOTE — Telephone Encounter (Signed)
 Called patient set up for labs on 1/23. Orders needed.

## 2025-01-19 NOTE — Telephone Encounter (Signed)
 Lab ordered. Will await results prior to refilling terbinafine .

## 2025-01-21 ENCOUNTER — Other Ambulatory Visit (INDEPENDENT_AMBULATORY_CARE_PROVIDER_SITE_OTHER)

## 2025-01-21 ENCOUNTER — Other Ambulatory Visit: Payer: Self-pay | Admitting: Family Medicine

## 2025-01-21 DIAGNOSIS — B351 Tinea unguium: Secondary | ICD-10-CM

## 2025-01-22 LAB — HEPATIC FUNCTION PANEL
AG Ratio: 1.8 (calc) (ref 1.0–2.5)
ALT: 14 U/L (ref 9–46)
AST: 17 U/L (ref 10–35)
Albumin: 4.5 g/dL (ref 3.6–5.1)
Alkaline phosphatase (APISO): 65 U/L (ref 35–144)
Bilirubin, Direct: 0.2 mg/dL (ref 0.0–0.2)
Globulin: 2.5 g/dL (ref 1.9–3.7)
Indirect Bilirubin: 0.7 mg/dL (ref 0.2–1.2)
Total Bilirubin: 0.9 mg/dL (ref 0.2–1.2)
Total Protein: 7 g/dL (ref 6.1–8.1)

## 2025-01-22 NOTE — Telephone Encounter (Signed)
 See lab result note.

## 2025-01-24 ENCOUNTER — Ambulatory Visit: Payer: Self-pay | Admitting: Family Medicine

## 2025-09-28 ENCOUNTER — Ambulatory Visit
